# Patient Record
Sex: Female | Born: 1985 | Race: Black or African American | Hispanic: No | Marital: Single | Smoking: Never smoker
Health system: Southern US, Community
[De-identification: ages and names within clinical notes are randomized; demographics above are authoritative.]

## PROBLEM LIST (undated history)

## (undated) DIAGNOSIS — E119 Type 2 diabetes mellitus without complications: Secondary | ICD-10-CM

---

## 2017-06-21 ENCOUNTER — Encounter: Payer: Self-pay | Admitting: Emergency Medicine

## 2017-06-21 ENCOUNTER — Ambulatory Visit
Admission: EM | Admit: 2017-06-21 | Discharge: 2017-06-21 | Disposition: A | Discharge status: Home / Self Care | Payer: BC Managed Care – PPO | Attending: Family Medicine | Admitting: Family Medicine

## 2017-06-21 ENCOUNTER — Other Ambulatory Visit: Payer: Self-pay

## 2017-06-21 DIAGNOSIS — J020 Streptococcal pharyngitis: Secondary | ICD-10-CM

## 2017-06-21 DIAGNOSIS — R05 Cough: Secondary | ICD-10-CM

## 2017-06-21 DIAGNOSIS — M791 Myalgia, unspecified site: Secondary | ICD-10-CM

## 2017-06-21 DIAGNOSIS — J069 Acute upper respiratory infection, unspecified: Secondary | ICD-10-CM

## 2017-06-21 DIAGNOSIS — B9789 Other viral agents as the cause of diseases classified elsewhere: Secondary | ICD-10-CM | POA: Diagnosis not present

## 2017-06-21 DIAGNOSIS — J029 Acute pharyngitis, unspecified: Secondary | ICD-10-CM | POA: Diagnosis not present

## 2017-06-21 HISTORY — DX: Type 2 diabetes mellitus without complications: E11.9

## 2017-06-21 LAB — RAPID STREP SCREEN (MED CTR MEBANE ONLY): STREPTOCOCCUS, GROUP A SCREEN (DIRECT): POSITIVE — AB

## 2017-06-21 MED ORDER — AMOXICILLIN 875 MG PO TABS: 875.0000 mg | ORAL_TABLET | Freq: Two times a day (BID) | ORAL | 0 refills | Status: DC

## 2017-06-21 MED ORDER — HYDROCOD POLST-CPM POLST ER 10-8 MG/5ML PO SUER: 5.0000 mL | Freq: Every evening | ORAL | 0 refills | Status: DC | PRN

## 2017-06-21 NOTE — Discharge Instructions (Signed)
Take medication as prescribed. Rest. Drink plenty of fluids.  ° °Follow up with your primary care physician this week as needed. Return to Urgent care for new or worsening concerns.  ° °

## 2017-06-21 NOTE — ED Triage Notes (Signed)
Patient in today c/o nasal congestion x 2 day and slight sore throat. Patient denies fever, but has had chills. Patient has tried OTC Theraflu and generic Zyrtec.

## 2017-06-21 NOTE — ED Provider Notes (Signed)
[facility] ____________________________________________  Time seen: Approximately 0840 AM  I have reviewed the triage vital signs and the nursing notes.   HISTORY  Chief Complaint Nasal Congestion   HPI Gina Clayton is a 32 y.o. female presenting for evaluation of 2 days of runny nose, nasal congestion, sinus pressure, postnasal drainage, scratchy throat and some body aches.  Denies fevers or sensation of fever.  States sore throat currently is better than yesterday.  States does have a history of allergies and this feels similar and feels similar to previous sinus infections.  Denies chest pain, shortness of breath or hemoptysis.  Has continue to remain active.  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  Denies home sick contacts.  Has taken some over-the-counter decongestants without much change.  Denies other aggravating or alleviating factors. Denies chest pain, shortness of breath, abdominal pain, dysuria, extremity pain, extremity swelling or rash. Denies recent sickness. Denies recent antibiotic use.   Patient's last menstrual period was 05/26/2017 (approximate).   Denies pregnancy    Past Medical History:  Diagnosis Date  . Diabetes mellitus without complication (HCC)    diet and exercise controlled.    There are no active problems to display for this patient.   Past Surgical History:  Procedure Laterality Date  . CESAREAN SECTION       No current facility-administered medications for this encounter.   Current Outpatient Medications:  .  amoxicillin (AMOXIL) 875 MG tablet, Take 1 tablet (875 mg total) by mouth 2 (two) times daily., Disp: 20 tablet, Rfl: 0 .  chlorpheniramine-HYDROcodone (TUSSIONEX PENNKINETIC ER) 10-8 MG/5ML SUER, Take 5 mLs by mouth at bedtime as needed for cough. do not drive or operate machinery while taking as can cause drowsiness., Disp: 50 mL, Rfl: 0  Allergies Patient has no known allergies.  Family  History  Problem Relation Age of Onset  . Hypertension Mother   . Diabetes Father   . Hypertension Father   . Diverticulitis Father     Social History Social History   Tobacco Use  . Smoking status: Never Smoker  . Smokeless tobacco: Never Used  Substance Use Topics  . Alcohol use: Yes    Comment: rarely  . Drug use: No    Review of Systems Constitutional: No fever/chills Eyes: No visual changes. ENT: As above Cardiovascular: Denies chest pain. Respiratory: Denies shortness of breath. Gastrointestinal: No abdominal pain.  No nausea, no vomiting.  No diarrhea. Genitourinary: Negative for dysuria. Musculoskeletal: Negative for back pain. Skin: Negative for rash.   ____________________________________________   PHYSICAL EXAM:  VITAL SIGNS: ED Triage Vitals  Enc Vitals Group     BP 06/21/17 0831 (!) 148/91     Pulse Rate 06/21/17 0831 (!) 101     Resp 06/21/17 0831 20     Temp 06/21/17 0831 98.7 F (37.1 C)     Temp Source 06/21/17 0831 Oral     SpO2 06/21/17 0831 100 %     Weight 06/21/17 0832 240 lb (108.9 kg)     Height 06/21/17 0832 5' (1.524 m)     Head Circumference --      Peak Flow --      Pain Score 06/21/17 0831 3     Pain Loc --      Pain Edu? --      Excl. in GC? --     Constitutional: Alert and oriented. Well appearing and in no acute distress. Eyes: Conjunctivae are normal.  Head:  Atraumatic.  Mild bilateral frontal and maxillary sinus tenderness palpation.  No swelling. No erythema.  Ears: no erythema, normal TMs bilaterally.   Nose:Nasal congestion with clear rhinorrhea  Mouth/Throat: Mucous membranes are moist. No pharyngeal erythema. 2+ tonsillar swelling bilaterally.  No exudate or uvular shift.  Neck: No stridor.  No cervical spine tenderness to palpation. Hematological/Lymphatic/Immunilogical: No cervical lymphadenopathy. Cardiovascular: Normal rate, regular rhythm. Grossly normal heart sounds.  Good peripheral  circulation. Respiratory: Normal respiratory effort.  No retractions. No wheezes, rales or rhonchi. Good air movement.  Musculoskeletal: Ambulatory with steady gait.  Neurologic:  Normal speech and language. No gait instability. Skin:  Skin appears warm, dry and intact. No rash noted. Psychiatric: Mood and affect are normal. Speech and behavior are normal. ___________________________________________   LABS (all labs ordered are listed, but only abnormal results are displayed)  Labs Reviewed  RAPID STREP SCREEN (NOT AT [facility]) - Abnormal; Notable for the following components:      Result Value   Streptococcus, Group A Screen (Direct) POSITIVE (*)    All other components within normal limits   ____________________________________________   PROCEDURES Procedures    INITIAL IMPRESSION / ASSESSMENT AND PLAN / ED COURSE  Pertinent labs & imaging results that were available during my care of the patient were reviewed by me and considered in my medical decision making (see chart for details).  Well-appearing patient.  No acute distress.  Suspect viral upper respiratory infection.  Patient denies fevers, felt less likely to be influenza-like.  Patient reports that she does have baseline large tonsils, sore throat better today than yesterday, however strep positive.  Will treat with oral amoxicillin, as needed Tussionex, over-the-counter Sudafed and supportive agents.  Encouraged rest, fluids, supportive care.  Work note given for today and tomorrow. Discussed indication, risks and benefits of medications with patient.  Discussed follow up with Primary care physician this week. Discussed follow up and return parameters including no resolution or any worsening concerns. Patient verbalized understanding and agreed to plan.   ____________________________________________   FINAL CLINICAL IMPRESSION(S) / ED DIAGNOSES  Final diagnoses:  Viral URI with cough  Strep pharyngitis     ED  Discharge Orders        Ordered    chlorpheniramine-HYDROcodone (TUSSIONEX PENNKINETIC ER) 10-8 MG/5ML SUER  At bedtime PRN     06/21/17 0852    amoxicillin (AMOXIL) 875 MG tablet  2 times daily     06/21/17 0854       Note: This dictation was prepared with Dragon dictation along with smaller phrase technology. Any transcriptional errors that result from this process are unintentional.         Renford Dills, NP 06/21/17 [phone]

## 2018-10-24 ENCOUNTER — Other Ambulatory Visit: Payer: Self-pay

## 2018-10-24 ENCOUNTER — Ambulatory Visit
Admission: EM | Admit: 2018-10-24 | Discharge: 2018-10-24 | Disposition: A | Discharge status: Home / Self Care | Payer: BC Managed Care – PPO | Attending: Family Medicine | Admitting: Family Medicine

## 2018-10-24 ENCOUNTER — Encounter: Payer: Self-pay | Admitting: Emergency Medicine

## 2018-10-24 DIAGNOSIS — R030 Elevated blood-pressure reading, without diagnosis of hypertension: Secondary | ICD-10-CM | POA: Diagnosis not present

## 2018-10-24 DIAGNOSIS — J029 Acute pharyngitis, unspecified: Secondary | ICD-10-CM

## 2018-10-24 LAB — RAPID STREP SCREEN (MED CTR MEBANE ONLY): Streptococcus, Group A Screen (Direct): NEGATIVE

## 2018-10-24 NOTE — ED Triage Notes (Signed)
Patient c/o sore throat that started last night.  Patient denies fevers.  Patient denies cough or SOB.

## 2018-10-24 NOTE — Discharge Instructions (Addendum)
Recommend continue Ibuprofen 600mg  every 6 hours as needed for pain. May continue Zyrtec daily or similar medication. Follow up pending strep culture and COVID 19 testing.

## 2018-10-24 NOTE — ED Provider Notes (Addendum)
[facility]    CSN: [id number] Arrival date & time: 10/24/18  8921     History   Chief Complaint Chief Complaint  Patient presents with  . Sore Throat    HPI Gina Clayton is a 33 y.o. female.   33 year old female presents with sore throat that started last night. Denies any fever, cough or shortness of breath. Having some nasal congestion but denies any GI symptoms. Has history of enlarged tonsils and symptoms seem similar to previous episode of strep last year. Sore throat slightly better today. Has taken Ibuprofen with some relief and Zyrtec with minimal relief. No known exposure to COVID-19.  Does have a history of elevated glucose and blood pressure which are controlled through diet. Currently on Fe and vit D supplements and has [place] IUD in place.   The history is provided by the patient.    Past Medical History:  Diagnosis Date  . Diabetes mellitus without complication (HCC)    diet and exercise controlled.    There are no active problems to display for this patient.   Past Surgical History:  Procedure Laterality Date  . CESAREAN SECTION      OB History   No obstetric history on file.      Home Medications    Prior to Admission medications   Medication Sig Start Date End Date Taking? Authorizing Provider  FEROSUL 325 (65 Fe) MG tablet TK 1 T PO QOD 06/04/18  Yes [provider]  Levonorgestrel (SKYLA) 13.5 MG IUD by Intrauterine route.   Yes [provider]  Vitamin D, Ergocalciferol, (DRISDOL) 1.25 MG (50000 UT) CAPS capsule TK 1 C PO 1 TIME WEEKLY WF 06/03/18  Yes [provider]    Family History Family History  Problem Relation Age of Onset  . Hypertension Mother   . Diabetes Father   . Hypertension Father   . Diverticulitis Father     Social History Social History   Tobacco Use  . Smoking status: Never Smoker  . Smokeless tobacco: Never Used  Substance Use Topics  . Alcohol use: Yes    Comment:  rarely  . Drug use: No     Allergies   Patient has no known allergies.   Review of Systems Review of Systems  Constitutional: Positive for fatigue. Negative for activity change, appetite change, chills and fever.  HENT: Positive for congestion, postnasal drip, rhinorrhea, sore throat and trouble swallowing. Negative for ear discharge, ear pain, facial swelling, mouth sores, nosebleeds, sinus pressure, sinus pain and sneezing.   Eyes: Negative for pain, discharge, redness and itching.  Respiratory: Negative for cough, chest tightness, shortness of breath and wheezing.   Cardiovascular: Negative for chest pain.  Gastrointestinal: Negative for abdominal pain, diarrhea, nausea and vomiting.  Musculoskeletal: Negative for arthralgias, myalgias, neck pain and neck stiffness.  Skin: Negative for color change, rash and wound.  Allergic/Immunologic: Negative for immunocompromised state.  Neurological: Negative for dizziness, tremors, seizures, syncope, weakness, light-headedness, numbness and headaches.  Hematological: Negative for adenopathy. Does not bruise/bleed easily.     Physical Exam Triage Vital Signs ED Triage Vitals  Enc Vitals Group     BP 10/24/18 0839 (!) 151/102     Pulse Rate 10/24/18 0839 (!) 108     Resp 10/24/18 0839 16     Temp 10/24/18 0839 98.4 F (36.9 C)     Temp Source 10/24/18 0839 Oral     SpO2 10/24/18 0839 98 %  Weight 10/24/18 0836 245 lb (111.1 kg)     Height 10/24/18 0836 5' (1.524 m)     Head Circumference --      Peak Flow --      Pain Score 10/24/18 0836 4     Pain Loc --      Pain Edu? --      Excl. in GC? --    No data found.  Updated Vital Signs BP (!) 151/102 (BP Location: Right Arm)   Pulse (!) 108   Temp 98.4 F (36.9 C) (Oral)   Resp 16   Ht 5' (1.524 m)   Wt 245 lb (111.1 kg)   LMP 09/27/2018 (Approximate)   SpO2 98%   BMI 47.85 kg/m   Visual Acuity Right Eye Distance:   Left Eye Distance:   Bilateral Distance:     Right Eye Near:   Left Eye Near:    Bilateral Near:     Physical Exam Vitals signs and nursing note reviewed.  Constitutional:      General: She is awake. She is not in acute distress.    Appearance: She is well-developed and well-groomed. She is not ill-appearing.     Comments: Patient sitting comfortably on exam table in no acute distress.   HENT:     Head: Normocephalic and atraumatic.     Right Ear: Hearing, tympanic membrane, ear canal and external ear normal.     Left Ear: Hearing, tympanic membrane, ear canal and external ear normal.     Nose: Rhinorrhea present.     Right Sinus: No maxillary sinus tenderness or frontal sinus tenderness.     Left Sinus: No maxillary sinus tenderness or frontal sinus tenderness.     Mouth/Throat:     Lips: Pink.     Mouth: Mucous membranes are moist.     Pharynx: Uvula midline. Posterior oropharyngeal erythema present. No pharyngeal swelling or oropharyngeal exudate.     Tonsils: No tonsillar exudate. 3+ on the right. 3+ on the left.  Eyes:     Extraocular Movements: Extraocular movements intact.     Conjunctiva/sclera: Conjunctivae normal.  Neck:     Musculoskeletal: Normal range of motion and neck supple. No neck rigidity or muscular tenderness.  Cardiovascular:     Rate and Rhythm: Regular rhythm. Tachycardia present.     Heart sounds: Normal heart sounds. No murmur.  Pulmonary:     Effort: Pulmonary effort is normal. No respiratory distress.     Breath sounds: Normal breath sounds and air entry. No decreased air movement. No decreased breath sounds, wheezing, rhonchi or rales.  Musculoskeletal: Normal range of motion.  Lymphadenopathy:     Cervical: Cervical adenopathy present.     Right cervical: Superficial cervical adenopathy present.     Left cervical: Superficial cervical adenopathy present.  Skin:    General: Skin is warm and dry.     Capillary Refill: Capillary refill takes less than 2 seconds.     Findings: No rash.   Neurological:     General: No focal deficit present.     Mental Status: She is alert and oriented to person, place, and time.  Psychiatric:        Mood and Affect: Mood normal.        Behavior: Behavior normal. Behavior is cooperative.        Thought Content: Thought content normal.        Judgment: Judgment normal.      UC Treatments / Results  Labs (all labs ordered are listed, but only abnormal results are displayed) Labs Reviewed  RAPID STREP SCREEN ([facility] ONLY)  CULTURE, GROUP A STREP ([facility])  NOVEL CORONAVIRUS, NAA (HOSPITAL ORDER, SEND-OUT TO REF LAB)    EKG   Radiology No results found.  Procedures Procedures (including critical care time)  Medications Ordered in UC Medications - No data to display  Initial Impression / Assessment and Plan / UC Course  I have reviewed the triage vital signs and the nursing notes.  Pertinent labs & imaging results that were available during my care of the patient were reviewed by me and considered in my medical decision making (see chart for details).    Reviewed negative rapid strep test with patient. Will send swab for throat culture. Discussed COVID-19 testing- able to obtain swab here and send out for testing- patient agrees to testing. Discussed that she probably has a viral illness and can not rule out COVID until test results are back. Encouraged to stay home and avoid  contact with other people. May continue Ibuprofen 600mg  every 6 hours as needed for pain. May continue Zyrtec or similar OTC allergy medication for symptoms. Briefly discussed elevated blood pressure- has been elevated in the past- continue to monitor. May need to see her GYN or a PCP for management. Follow-up pending strep culture and COVID-19 test results.  Final Clinical Impressions(s) / UC Diagnoses   Final diagnoses:  Sore throat  Elevated blood-pressure reading, without diagnosis of hypertension     Discharge Instructions     Recommend  continue Ibuprofen 600mg  every 6 hours as needed for pain. May continue Zyrtec daily or similar medication. Follow up pending strep culture and COVID 19 testing.     ED Prescriptions    None     Controlled Substance Prescriptions [place] Controlled Substance Registry consulted? Not Applicable   Sudie Grumblingmyot, Ernan Runkles Berry, NP 10/24/18 1104    Sudie GrumblingAmyot, Maddax Palinkas Berry, NP 10/24/18 1114

## 2018-10-25 LAB — NOVEL CORONAVIRUS, NAA (HOSP ORDER, SEND-OUT TO REF LAB; TAT 18-24 HRS): SARS-CoV-2, NAA: NOT DETECTED

## 2018-10-25 LAB — CULTURE, GROUP A STREP (THRC)

## 2018-10-26 ENCOUNTER — Telehealth (HOSPITAL_COMMUNITY): Payer: Self-pay | Admitting: Emergency Medicine

## 2018-10-26 NOTE — Telephone Encounter (Signed)
Attempted to call patient about neg covid and strep results. Phone number not working in epic.

## 2018-10-31 NOTE — Progress Notes (Signed)
Order placed on 10/24/18. Result not crossing over. Result was finalized on 10/25/18 and able to access results through patient chart in result inquiry. Uncertain if issue with Epic and lab order. May need to call patient with result since it may not be released through [facility].

## 2018-11-11 ENCOUNTER — Other Ambulatory Visit: Payer: Self-pay

## 2018-11-11 ENCOUNTER — Encounter: Payer: Self-pay | Admitting: Emergency Medicine

## 2018-11-11 ENCOUNTER — Emergency Department: Payer: BC Managed Care – PPO

## 2018-11-11 ENCOUNTER — Inpatient Hospital Stay
Admission: EM | Admit: 2018-11-11 | Discharge: 2018-11-12 | DRG: 177 | Payer: BC Managed Care – PPO | Attending: Internal Medicine | Admitting: Internal Medicine

## 2018-11-11 ENCOUNTER — Telehealth: Payer: BC Managed Care – PPO | Admitting: Family

## 2018-11-11 DIAGNOSIS — J1289 Other viral pneumonia: Secondary | ICD-10-CM | POA: Diagnosis present

## 2018-11-11 DIAGNOSIS — R0602 Shortness of breath: Secondary | ICD-10-CM | POA: Diagnosis present

## 2018-11-11 DIAGNOSIS — Z833 Family history of diabetes mellitus: Secondary | ICD-10-CM | POA: Diagnosis not present

## 2018-11-11 DIAGNOSIS — U071 COVID-19: Secondary | ICD-10-CM | POA: Diagnosis present

## 2018-11-11 DIAGNOSIS — E119 Type 2 diabetes mellitus without complications: Secondary | ICD-10-CM | POA: Diagnosis present

## 2018-11-11 DIAGNOSIS — Z6839 Body mass index (BMI) 39.0-39.9, adult: Secondary | ICD-10-CM | POA: Diagnosis not present

## 2018-11-11 DIAGNOSIS — Z975 Presence of (intrauterine) contraceptive device: Secondary | ICD-10-CM | POA: Diagnosis not present

## 2018-11-11 DIAGNOSIS — Z793 Long term (current) use of hormonal contraceptives: Secondary | ICD-10-CM | POA: Diagnosis not present

## 2018-11-11 DIAGNOSIS — Z8249 Family history of ischemic heart disease and other diseases of the circulatory system: Secondary | ICD-10-CM

## 2018-11-11 DIAGNOSIS — Z79899 Other long term (current) drug therapy: Secondary | ICD-10-CM | POA: Diagnosis not present

## 2018-11-11 DIAGNOSIS — R0600 Dyspnea, unspecified: Secondary | ICD-10-CM

## 2018-11-11 DIAGNOSIS — R079 Chest pain, unspecified: Secondary | ICD-10-CM | POA: Diagnosis not present

## 2018-11-11 DIAGNOSIS — E669 Obesity, unspecified: Secondary | ICD-10-CM | POA: Diagnosis not present

## 2018-11-11 DIAGNOSIS — J1282 Pneumonia due to coronavirus disease 2019: Secondary | ICD-10-CM | POA: Diagnosis present

## 2018-11-11 DIAGNOSIS — R000 Tachycardia, unspecified: Secondary | ICD-10-CM | POA: Diagnosis not present

## 2018-11-11 DIAGNOSIS — J069 Acute upper respiratory infection, unspecified: Secondary | ICD-10-CM | POA: Diagnosis not present

## 2018-11-11 LAB — COMPREHENSIVE METABOLIC PANEL
ALT: 18 U/L (ref 0–44)
AST: 33 U/L (ref 15–41)
Albumin: 3.6 g/dL (ref 3.5–5.0)
Alkaline Phosphatase: 88 U/L (ref 38–126)
Anion gap: 12 (ref 5–15)
BUN: 14 mg/dL (ref 6–20)
CO2: 25 mmol/L (ref 22–32)
Calcium: 8.3 mg/dL — ABNORMAL LOW (ref 8.9–10.3)
Chloride: 100 mmol/L (ref 98–111)
Creatinine, Ser: 0.9 mg/dL (ref 0.44–1.00)
GFR calc Af Amer: >60 mL/min (ref >60)
GFR calc non Af Amer: >60 mL/min (ref >60)
Glucose, Bld: 101 mg/dL — ABNORMAL HIGH (ref 70–99)
Potassium: 3.4 mmol/L — ABNORMAL LOW (ref 3.5–5.1)
Sodium: 137 mmol/L (ref 135–145)
Total Bilirubin: 0.6 mg/dL (ref 0.3–1.2)
Total Protein: 8 g/dL (ref 6.5–8.1)

## 2018-11-11 LAB — CBC WITH DIFFERENTIAL/PLATELET
Abs Immature Granulocytes: 0.07 10*3/uL (ref 0.00–0.07)
Basophils Absolute: 0 10*3/uL (ref 0.0–0.1)
Basophils Relative: 0 %
Eosinophils Absolute: 0 10*3/uL (ref 0.0–0.5)
Eosinophils Relative: 0 %
HCT: 39.3 % (ref 36.0–46.0)
Hemoglobin: 12.8 g/dL (ref 12.0–15.0)
Immature Granulocytes: 1 %
Lymphocytes Relative: 20 %
Lymphs Abs: 2.5 10*3/uL (ref 0.7–4.0)
MCH: 27 pg (ref 26.0–34.0)
MCHC: 32.6 g/dL (ref 30.0–36.0)
MCV: 82.9 fL (ref 80.0–100.0)
Monocytes Absolute: 0.3 10*3/uL (ref 0.1–1.0)
Monocytes Relative: 2 %
Neutro Abs: 9.4 10*3/uL — ABNORMAL HIGH (ref 1.7–7.7)
Neutrophils Relative %: 77 %
Platelets: 302 10*3/uL (ref 150–400)
RBC: 4.74 MIL/uL (ref 3.87–5.11)
RDW: 15.8 % — ABNORMAL HIGH (ref 11.5–15.5)
WBC: 12.2 10*3/uL — ABNORMAL HIGH (ref 4.0–10.5)
nRBC: 0 % (ref 0.0–0.2)

## 2018-11-11 LAB — TROPONIN I (HIGH SENSITIVITY): Troponin I (High Sensitivity): 13 ng/L (ref <18)

## 2018-11-11 LAB — PROCALCITONIN: Procalcitonin: <0.1 ng/mL

## 2018-11-11 LAB — LACTIC ACID, PLASMA: Lactic Acid, Venous: 1.3 mmol/L (ref 0.5–1.9)

## 2018-11-11 MED ORDER — ZINC SULFATE 220 (50 ZN) MG PO CAPS
220.0000 mg | ORAL_CAPSULE | Freq: Every day | ORAL | Status: DC
  Administered 2018-11-12: 220 mg via ORAL
  Filled 2018-11-11: qty 1

## 2018-11-11 MED ORDER — FERROUS SULFATE 325 (65 FE) MG PO TABS
325.0000 mg | ORAL_TABLET | Freq: Every day | ORAL | Status: DC
  Administered 2018-11-12: 325 mg via ORAL
  Filled 2018-11-11: qty 1

## 2018-11-11 MED ORDER — LORAZEPAM 2 MG/ML IJ SOLN
1.0000 mg | Freq: Once | INTRAMUSCULAR | Status: AC
  Administered 2018-11-11: 19:00:00 1 mg via INTRAVENOUS

## 2018-11-11 MED ORDER — SODIUM CHLORIDE 0.9% FLUSH: 3.0000 mL | INTRAVENOUS | Status: DC | PRN

## 2018-11-11 MED ORDER — VITAMIN C 500 MG PO TABS
500.0000 mg | ORAL_TABLET | Freq: Every day | ORAL | Status: DC
  Administered 2018-11-12: 08:00:00 500 mg via ORAL
  Filled 2018-11-11: qty 1

## 2018-11-11 MED ORDER — DEXAMETHASONE SODIUM PHOSPHATE 10 MG/ML IJ SOLN
10.0000 mg | Freq: Once | INTRAMUSCULAR | Status: AC
  Administered 2018-11-11: 20:00:00 10 mg via INTRAVENOUS
  Filled 2018-11-11: qty 1

## 2018-11-11 MED ORDER — ENOXAPARIN SODIUM 40 MG/0.4ML ~~LOC~~ SOLN
40.0000 mg | Freq: Two times a day (BID) | SUBCUTANEOUS | Status: DC
  Administered 2018-11-12 (×2): 40 mg via SUBCUTANEOUS
  Filled 2018-11-11 (×2): qty 0.4

## 2018-11-11 MED ORDER — SODIUM CHLORIDE 0.9% FLUSH
3.0000 mL | Freq: Two times a day (BID) | INTRAVENOUS | Status: DC
  Administered 2018-11-12 (×2): 3 mL via INTRAVENOUS

## 2018-11-11 MED ORDER — DEXAMETHASONE SODIUM PHOSPHATE 10 MG/ML IJ SOLN
6.0000 mg | INTRAMUSCULAR | Status: DC
  Filled 2018-11-11: qty 0.6

## 2018-11-11 MED ORDER — SODIUM CHLORIDE 0.9% FLUSH: 3.0000 mL | Freq: Two times a day (BID) | INTRAVENOUS | Status: DC

## 2018-11-11 MED ORDER — ACETAMINOPHEN 325 MG PO TABS: 650.0000 mg | ORAL_TABLET | Freq: Four times a day (QID) | ORAL | Status: DC | PRN

## 2018-11-11 MED ORDER — SODIUM CHLORIDE 0.9 % IV SOLN
Freq: Once | INTRAVENOUS | Status: AC
Start: 2018-11-11 — End: 2018-11-11
  Administered 2018-11-11: 20:00:00 via INTRAVENOUS

## 2018-11-11 MED ORDER — ACETAMINOPHEN 500 MG PO TABS
1000.0000 mg | ORAL_TABLET | Freq: Once | ORAL | Status: AC
  Administered 2018-11-11: 20:00:00 1000 mg via ORAL
  Filled 2018-11-11: qty 2

## 2018-11-11 MED ORDER — SODIUM CHLORIDE 0.9 % IV SOLN: 250.0000 mL | INTRAVENOUS | Status: DC | PRN

## 2018-11-11 MED ORDER — LORAZEPAM 2 MG/ML IJ SOLN
INTRAMUSCULAR | Status: AC
  Filled 2018-11-11: qty 1

## 2018-11-11 MED ORDER — IPRATROPIUM-ALBUTEROL 20-100 MCG/ACT IN AERS
1.0000 | INHALATION_SPRAY | Freq: Four times a day (QID) | RESPIRATORY_TRACT | Status: DC
  Administered 2018-11-12 (×3): 1 via RESPIRATORY_TRACT
  Filled 2018-11-11: qty 4

## 2018-11-11 NOTE — Progress Notes (Signed)
  E-Visit for Corona Virus Screening  Based on what you have shared with me, you need to seek an evaluation for a severe illness that is causing your symptoms which may be coronavirus or some other illness. I recommend that you be seen and evaluated "face to face". Our Emergency Departments are best equipped to handle patients with severe symptoms.  You will be evaluated by the ER provider (or higher level of care provider) who will determine whether you need formal testing.   I recommend the following:  . If you are having a true medical emergency please call 911. . If you are considered high risk for Corona virus because of a known exposure, fever, shortness of breath and cough, OR if you have severe symptoms of any kind, seek medical care at an emergency room.   . [facility] Emergency Department [address], [place], [place] [address] [phone]  . [facility] [place] Emergency Department [address], [place], [place] [address] [phone]  . [facility] Emergency Department [address], [place], [place] [address] [phone]  . [facility] Emergency Department [address], [place], [place] [address] [phone]  . [facility] Emergency Department [address], [place], [place] [address] [phone]  NOTE: If you entered your credit card information for this eVisit, you will not be charged. You may see a "hold" on your card for the $35 but that hold will drop off and you will not have a charge processed.   Your e-visit answers were reviewed by a board certified advanced clinical practitioner to complete your personal care plan.  Thank you for using e-Visits.  

## 2018-11-11 NOTE — ED Notes (Signed)
Labs redrawn

## 2018-11-11 NOTE — H&P (Signed)
[facility] - [place] at [facility]   PATIENT NAME: Gina Clayton    MR#:  [id number]  DATE OF BIRTH:  04/12/1985  DATE OF ADMISSION:  11/11/2018  PRIMARY CARE PHYSICIAN: Patient, No Pcp Per   REQUESTING/REFERRING PHYSICIAN: Shaune Pollackameron Isaacs, MD  CHIEF COMPLAINT:   Chief Complaint  Patient presents with  . Chest Pain  . Shortness of Breath  . COVID Positive    HISTORY OF PRESENT ILLNESS:  Gina Clayton  is a 33 y.o. female who presented to the emergency room with increased shortness of breath and productive cough of clear mucus.  She was diagnosed with COVID-19 on 11/02/2018 at [facility].  She began having symptoms of increased shortness of breath as well as intermittent fevers and chills with malaise/fatigue and muscle aches on 11/06/2018.  Shortness of breath is made worse with minimal exertion such as walking less than one half city block on a flat surface.  She reports having developed nausea and vomiting today.  Temperature max at home has been 103 F.  She denies hematemesis, hematochezia, or melena.  She denies chest pain other than pain with severe cough.  She denies diarrhea or abdominal pain.  She denies a prior history of asthma or other pulmonary related problems.  She received Decadron IV shortly after arrival to the emergency room with improvement of oxygen saturation from 87% on room air to 96% currently on 2 L oxygen per nasal cannula.  Chest x-ray demonstrates perihilar opacities consistent with viral pneumonia.  EKG demonstrates sinus tachycardia with no ST elevations.  We have admitted her to the hospitalist service for further management.    PAST MEDICAL HISTORY:   Past Medical History:  Diagnosis Date  . Diabetes mellitus without complication (HCC)    diet and exercise controlled.    PAST SURGICAL HISTORY:   Past Surgical History:  Procedure Laterality Date  . CESAREAN SECTION      SOCIAL HISTORY:   Social History   Tobacco Use   . Smoking status: Never Smoker  . Smokeless tobacco: Never Used  Substance Use Topics  . Alcohol use: Yes    Comment: rarely    FAMILY HISTORY:   Family History  Problem Relation Age of Onset  . Hypertension Mother   . Diabetes Father   . Hypertension Father   . Diverticulitis Father     DRUG ALLERGIES:  No Known Allergies  REVIEW OF SYSTEMS:   Review of Systems  Constitutional: Positive for chills, fever and malaise/fatigue.  HENT: Positive for congestion. Negative for sinus pain and sore throat.   Eyes: Negative for blurred vision and double vision.  Respiratory: Positive for cough, sputum production, shortness of breath and wheezing.   Cardiovascular: Positive for chest pain (With cough). Negative for palpitations and leg swelling.  Gastrointestinal: Positive for nausea and vomiting. Negative for abdominal pain, blood in stool, constipation, diarrhea and heartburn.  Genitourinary: Negative for dysuria, flank pain and hematuria.  Musculoskeletal: Negative for falls and myalgias.  Skin: Negative for itching and rash.  Neurological: Negative for dizziness, focal weakness, loss of consciousness, weakness and headaches.  Psychiatric/Behavioral: Negative.  Negative for depression.      MEDICATIONS AT HOME:   Prior to Admission medications   Medication Sig Start Date End Date Taking? Authorizing Provider  FEROSUL 325 (65 Fe) MG tablet TK 1 T PO QOD 06/04/18  Yes [provider]  Levonorgestrel (SKYLA) 13.5 MG IUD by Intrauterine route.   Yes [provider]  Vitamin D, Ergocalciferol, (DRISDOL) 1.25 MG (50000 UT) CAPS capsule TK 1 C PO 1 TIME WEEKLY WF 06/03/18  Yes [provider]      VITAL SIGNS:  Blood pressure (!) 116/98, pulse 100, temperature 99.4 F (37.4 C), temperature source Oral, resp. rate (!) 28, height 5' (1.524 m), weight 111.1 kg, last menstrual period 10/31/2018, SpO2 98 %.  PHYSICAL EXAMINATION:  Physical Exam  GENERAL:   33 y.o.-year-old patient lying in the bed with no acute distress.  EYES: Pupils equal, round, reactive to light and accommodation. No scleral icterus. Extraocular muscles intact.  HEENT: Head atraumatic, normocephalic. Oropharynx and nasopharynx clear.  NECK:  Supple, no jugular venous distention. No thyroid enlargement, no tenderness.  LUNGS: Bilateral expiratory wheezes. no use of accessory muscles of respiration.  CARDIOVASCULAR: Regular rate and rhythm, S1, S2 normal. No murmurs, rubs, or gallops.  ABDOMEN: Abdominal obesity soft, nondistended, nontender. Bowel sounds present. No organomegaly or mass.  EXTREMITIES: No pedal edema, cyanosis, or clubbing.  NEUROLOGIC: Cranial nerves II through XII are intact. Muscle strength 5/5 in all extremities. Sensation intact. Gait not checked.  PSYCHIATRIC: The patient is alert and oriented x 3.  Normal affect and good eye contact. SKIN: No obvious rash, lesion, or ulcer.   LABORATORY PANEL:   CBC Recent Labs  Lab 11/11/18 1918  WBC 12.2*  HGB 12.8  HCT 39.3  PLT 302   ------------------------------------------------------------------------------------------------------------------  Chemistries  Recent Labs  Lab 11/11/18 2018  NA 137  K 3.4*  CL 100  CO2 25  GLUCOSE 101*  BUN 14  CREATININE 0.90  CALCIUM 8.3*  AST 33  ALT 18  ALKPHOS 88  BILITOT 0.6   ------------------------------------------------------------------------------------------------------------------  Cardiac Enzymes No results for input(s): TROPONINI in the last 168 hours. ------------------------------------------------------------------------------------------------------------------  RADIOLOGY:  Dg Chest Portable 1 View  Result Date: 11/11/2018 CLINICAL DATA:  Pt to ED via POV stating that she tested + for COVID Friday before last. Pt states that she is having severe chest pain, shortness of breath and coughing.SOB, cough EXAM: PORTABLE CHEST 1 VIEW  COMPARISON:  None. FINDINGS: Normal cardiac silhouette. Perihilar airspace densities. Low lung volumes. No acute osseous abnormality. IMPRESSION: Perihilar airspace densities consistent with viral pneumonia. Electronically Signed   By: Genevive BiStewart  Edmunds M.D.   On: 11/11/2018 20:29      IMPRESSION AND PLAN:   1.  Viral pneumonia secondary to COVID-19 - She has O2 at 2 L/min per nasal cannula - We will continue IV Decadron 6 mg daily - Combivent inhaler every 6 hours as needed for shortness of breath -Sputum culture pending - Interleukin-6 pending -We will treat nausea and vomiting with IV antiemetic -Treat fever with Tylenol as needed  2.  Generalized weakness - We will consult physical therapy for supportive care  3.  History of diabetes mellitus with diet control - Will monitor with every 6 hour glucometer readings as patient is receiving IV steroid therapy  DVT and PPI prophylaxis    All the records are reviewed and case discussed with ED provider. The plan of care was discussed in details with the patient (and family). I answered all questions. The patient agreed to proceed with the above mentioned plan. Further management will depend upon hospital course.   CODE STATUS: Full code  TOTAL TIME TAKING CARE OF THIS PATIENT:45 minutes.    Milas Kocherngela H [place] crnp on 11/11/2018 at 11:04 PM  Pager - [phone]  After 6pm go to [email or web address] - password [email or web address]  [facility] [place] [facility]  Office  [phone]  CC: Primary care physician; Patient, No Pcp Per   Note: This dictation was prepared with Dragon dictation along with smaller phrase technology. Any transcriptional errors that result from this process are unintentional.

## 2018-11-11 NOTE — ED Notes (Signed)
First Nurse Note: Pt to ED via [place] stating that she tested + for COVID Friday before last. Pt states that she is having severe chest pain, shortness of breath and coughing.

## 2018-11-11 NOTE — ED Triage Notes (Signed)
Pt arrived via POV with reports of shortness of breath, cough and fever. Pt reports fever since Wednesday.   Pt reports COVID positive 7/24.

## 2018-11-11 NOTE — ED Provider Notes (Signed)
[facility] Emergency Department Provider Note  ____________________________________________   First MD Initiated Contact with Patient 11/11/18 1853     (approximate)  I have reviewed the triage vital signs and the nursing notes.   HISTORY  Chief Complaint Chest Pain, Shortness of Breath, and COVID Positive    HPI Gina Clayton is a 33 y.o. female  Here with cough, SOB. Pt was just diagnosed with COVID-19 on 7/24 at [facility]. She states that since then, she's had persistent cough,f ever, and now SOB. Over the past 24 hours, she's had worsening cough and SOB and has been unable to catch her breath. She's also developed nausea and emesis today. She has had persistent fevers, up to 103 F, over the past week. No h/o lung problems. Sx are worse with any kind of movement or exertion. She's also now developed some nausea and vomiting.        Past Medical History:  Diagnosis Date  . Diabetes mellitus without complication (HCC)    diet and exercise controlled.    Patient Active Problem List   Diagnosis Date Noted  . Pneumonia due to 2019 novel coronavirus 11/11/2018    Past Surgical History:  Procedure Laterality Date  . CESAREAN SECTION      Prior to Admission medications   Medication Sig Start Date End Date Taking? Authorizing Provider  FEROSUL 325 (65 Fe) MG tablet TK 1 T PO QOD 06/04/18  Yes [provider]  Levonorgestrel (SKYLA) 13.5 MG IUD by Intrauterine route.   Yes [provider]  Vitamin D, Ergocalciferol, (DRISDOL) 1.25 MG (50000 UT) CAPS capsule TK 1 C PO 1 TIME WEEKLY WF 06/03/18  Yes [provider]    Allergies Patient has no known allergies.  Family History  Problem Relation Age of Onset  . Hypertension Mother   . Diabetes Father   . Hypertension Father   . Diverticulitis Father     Social History Social History   Tobacco Use  . Smoking status: Never Smoker  . Smokeless tobacco: Never Used   Substance Use Topics  . Alcohol use: Yes    Comment: rarely  . Drug use: No    Review of Systems  Review of Systems  Constitutional: Positive for fatigue and fever.  HENT: Negative for congestion and sore throat.   Eyes: Negative for visual disturbance.  Respiratory: Positive for cough and shortness of breath.   Cardiovascular: Negative for chest pain.  Gastrointestinal: Positive for nausea and vomiting. Negative for abdominal pain and diarrhea.  Genitourinary: Negative for flank pain.  Musculoskeletal: Negative for back pain and neck pain.  Skin: Negative for rash and wound.  Neurological: Positive for weakness.  All other systems reviewed and are negative.    ____________________________________________  PHYSICAL EXAM:      VITAL SIGNS: ED Triage Vitals  Enc Vitals Group     BP 11/11/18 1900 136/87     Pulse Rate 11/11/18 1900 (!) 120     Resp 11/11/18 1900 (!) 26     Temp 11/11/18 1900 100.1 F (37.8 C)     Temp Source 11/11/18 1900 Oral     SpO2 11/11/18 1900 93 %     Weight 11/11/18 1853 245 lb (111.1 kg)     Height 11/11/18 1853 5' (1.524 m)     Head Circumference --      Peak Flow --      Pain Score 11/11/18 1852 7     Pain Loc --  Pain Edu? --      Excl. in GC? --      Physical Exam Vitals signs and nursing note reviewed.  Constitutional:      General: She is not in acute distress.    Appearance: She is well-developed.  HENT:     Head: Normocephalic and atraumatic.  Eyes:     Conjunctiva/sclera: Conjunctivae normal.  Neck:     Musculoskeletal: Neck supple.  Cardiovascular:     Rate and Rhythm: Regular rhythm. Tachycardia present.     Heart sounds: Normal heart sounds.  Pulmonary:     Effort: Pulmonary effort is normal. Tachypnea present. No respiratory distress.     Breath sounds: Rhonchi and rales present. No wheezing.  Abdominal:     General: There is no distension.  Skin:    General: Skin is warm.     Capillary Refill: Capillary  refill takes less than 2 seconds.     Findings: No rash.  Neurological:     Mental Status: She is alert and oriented to person, place, and time.     Motor: No abnormal muscle tone.       ____________________________________________   LABS (all labs ordered are listed, but only abnormal results are displayed)  Labs Reviewed  CBC WITH DIFFERENTIAL/PLATELET - Abnormal; Notable for the following components:      Result Value   WBC 12.2 (*)    RDW 15.8 (*)    Neutro Abs 9.4 (*)    All other components within normal limits  COMPREHENSIVE METABOLIC PANEL - Abnormal; Notable for the following components:   Potassium 3.4 (*)    Glucose, Bld 101 (*)    Calcium 8.3 (*)    All other components within normal limits  SARS CORONAVIRUS 2 (HOSPITAL ORDER, PERFORMED IN [facility])  CULTURE, BLOOD (ROUTINE X 2)  CULTURE, BLOOD (ROUTINE X 2)  LACTIC ACID, PLASMA  PROCALCITONIN  HIV ANTIBODY (ROUTINE TESTING W REFLEX)  CBC  CREATININE, SERUM  CBC WITH DIFFERENTIAL/PLATELET  C-REACTIVE PROTEIN  COMPREHENSIVE METABOLIC PANEL  INTERLEUKIN-6, PLASMA  POC URINE PREG, ED  ABO/RH  TROPONIN I (HIGH SENSITIVITY)  TROPONIN I (HIGH SENSITIVITY)  TROPONIN I (HIGH SENSITIVITY)    ____________________________________________  EKG: Sinus tachycardia, VR 109. Normal intervals. Diffuse TWI, possibly demand. No ST elevations. ________________________________________  RADIOLOGY All imaging, including plain films, CT scans, and ultrasounds, independently reviewed by me, and interpretations confirmed via formal radiology reads.  ED MD interpretation:   CXR: Perihilar opacities c/w viral PNA  Official radiology report(s): Dg Chest Portable 1 View  Result Date: 11/11/2018 CLINICAL DATA:  Pt to ED via POV stating that she tested + for COVID Friday before last. Pt states that she is having severe chest pain, shortness of breath and coughing.SOB, cough EXAM: PORTABLE CHEST 1 VIEW COMPARISON:   None. FINDINGS: Normal cardiac silhouette. Perihilar airspace densities. Low lung volumes. No acute osseous abnormality. IMPRESSION: Perihilar airspace densities consistent with viral pneumonia. Electronically Signed   By: Genevive BiStewart  Edmunds M.D.   On: 11/11/2018 20:29    ____________________________________________  PROCEDURES   Procedure(s) performed (including Critical Care):  Procedures  ____________________________________________  INITIAL IMPRESSION / MDM / ASSESSMENT AND PLAN / ED COURSE  As part of my medical decision making, I reviewed the following data within the electronic MEDICAL RECORD NUMBER Notes from prior ED visits and [place] Controlled Substance Database      *Gina Clayton was evaluated in Emergency Department on 11/12/2018 for the symptoms described in the  history of present illness. She was evaluated in the context of the global COVID-19 pandemic, which necessitated consideration that the patient might be at risk for infection with the SARS-CoV-2 virus that causes COVID-19. Institutional protocols and algorithms that pertain to the evaluation of patients at risk for COVID-19 are in a state of rapid change based on information released by regulatory bodies including the CDC and federal and state organizations. These policies and algorithms were followed during the patient's care in the ED.  Some ED evaluations and interventions may be delayed as a result of limited staffing during the pandemic.*      Medical Decision Making: 33 yo F here with hypoxic resp failure 2/2 COVID-19. CXR c/w viral PNA. [place], LA normal - doubt superimposed bacterial infection. Gentle fluids, tylenol, Decadron given. Admit.  ____________________________________________  FINAL CLINICAL IMPRESSION(S) / ED DIAGNOSES  Final diagnoses:  Pneumonia due to COVID-19 virus     MEDICATIONS GIVEN DURING THIS VISIT:  Medications  LORazepam (ATIVAN) 2 MG/ML injection (has no administration in time range)   ferrous sulfate tablet 325 mg (has no administration in time range)  enoxaparin (LOVENOX) injection 40 mg (has no administration in time range)  Ipratropium-Albuterol (COMBIVENT) respimat 1 puff (has no administration in time range)  vitamin C (ASCORBIC ACID) tablet 500 mg (has no administration in time range)  zinc sulfate capsule 220 mg (has no administration in time range)  acetaminophen (TYLENOL) tablet 650 mg (has no administration in time range)  sodium chloride flush (NS) 0.9 % injection 3 mL (has no administration in time range)  sodium chloride flush (NS) 0.9 % injection 3 mL (has no administration in time range)  0.9 %  sodium chloride infusion (has no administration in time range)  dexamethasone (DECADRON) injection 6 mg (has no administration in time range)  LORazepam (ATIVAN) injection 1 mg (1 mg Intravenous Given 11/11/18 1928)  dexamethasone (DECADRON) injection 10 mg (10 mg Intravenous Given 11/11/18 2015)  0.9 %  sodium chloride infusion ( Intravenous Stopped 11/11/18 2302)  acetaminophen (TYLENOL) tablet 1,000 mg (1,000 mg Oral Given 11/11/18 2013)     ED Discharge Orders    None       Note:  This document was prepared using Dragon voice recognition software and may include unintentional dictation errors.   Duffy Bruce, MD 11/12/18 [phone]

## 2018-11-11 NOTE — ED Notes (Signed)
ED TO INPATIENT HANDOFF REPORT  ED Nurse Name and Phone #: Bascom Levels Name/Age/Gender Gina Clayton 33 y.o. female Room/Bed: ED01A/ED01A  Code Status   Code Status: Full Code  Home/SNF/Other Home Patient oriented to: self, place, time and situation Is this baseline? Yes   Triage Complete: Triage complete  Chief Complaint COVID +  Triage Note Pt arrived via POV with reports of shortness of breath, cough and fever. Pt reports fever since Wednesday.   Pt reports COVID positive 7/24.    Allergies No Known Allergies  Level of Care/Admitting Diagnosis ED Disposition    ED Disposition Condition [facility] Area: [place] [[id number]]  Level of Care: Med-Surg [16]  Covid Evaluation: Confirmed COVID Positive  Diagnosis: Pneumonia due to 2019 novel coronavirus [[id number]]  Admitting Physician: Mayer Camel [[id number]]  Attending Physician: Mayer Camel [[id number]]  Estimated length of stay: past midnight tomorrow  Certification:: I certify this patient will need inpatient services for at least 2 midnights  PT Class (Do Not Modify): Inpatient [101]  PT Acc Code (Do Not Modify): Private [1]       B Medical/Surgery History Past Medical History:  Diagnosis Date  . Diabetes mellitus without complication (HCC)    diet and exercise controlled.   Past Surgical History:  Procedure Laterality Date  . CESAREAN SECTION       A IV Location/Drains/Wounds Patient Lines/Drains/Airways Status   Active Line/Drains/Airways    Name:   Placement date:   Placement time:   Site:   Days:   Peripheral IV 11/11/18 Left Antecubital   11/11/18    1931    Antecubital   less than 1          Intake/Output Last 24 hours No intake or output data in the 24 hours ending 11/11/18 2300  Labs/Imaging Results for orders placed or performed during the hospital encounter of 11/11/18 (from the past 48 hour(s))  CBC with Differential     Status: Abnormal   Collection Time: 11/11/18  7:18 PM  Result Value Ref Range   WBC 12.2 (H) 4.0 - 10.5 K/uL   RBC 4.74 3.87 - 5.11 MIL/uL   Hemoglobin 12.8 12.0 - 15.0 g/dL   HCT 39.3 36.0 - 46.0 %   MCV 82.9 80.0 - 100.0 fL   MCH 27.0 26.0 - 34.0 pg   MCHC 32.6 30.0 - 36.0 g/dL   RDW 15.8 (H) 11.5 - 15.5 %   Platelets 302 150 - 400 K/uL   nRBC 0.0 0.0 - 0.2 %   Neutrophils Relative % 77 %   Neutro Abs 9.4 (H) 1.7 - 7.7 K/uL   Lymphocytes Relative 20 %   Lymphs Abs 2.5 0.7 - 4.0 K/uL   Monocytes Relative 2 %   Monocytes Absolute 0.3 0.1 - 1.0 K/uL   Eosinophils Relative 0 %   Eosinophils Absolute 0.0 0.0 - 0.5 K/uL   Basophils Relative 0 %   Basophils Absolute 0.0 0.0 - 0.1 K/uL   Immature Granulocytes 1 %   Abs Immature Granulocytes 0.07 0.00 - 0.07 K/uL   Abnormal Lymphocytes Present PRESENT     Comment: Performed at [facility], [address] [place], [place] [address]  Comprehensive metabolic panel     Status: Abnormal   Collection Time: 11/11/18  8:18 PM  Result Value Ref Range   Sodium 137 135 - 145 mmol/L   Potassium 3.4 (L) 3.5 - 5.1 mmol/L   Chloride  100 98 - 111 mmol/L   CO2 25 22 - 32 mmol/L   Glucose, Bld 101 (H) 70 - 99 mg/dL   BUN 14 6 - 20 mg/dL   Creatinine, Ser 6.210.90 0.44 - 1.00 mg/dL   Calcium 8.3 (L) 8.9 - 10.3 mg/dL   Total Protein 8.0 6.5 - 8.1 g/dL   Albumin 3.6 3.5 - 5.0 g/dL   AST 33 15 - 41 U/L   ALT 18 0 - 44 U/L   Alkaline Phosphatase 88 38 - 126 U/L   Total Bilirubin 0.6 0.3 - 1.2 mg/dL   GFR calc non Af Amer >60 >60 mL/min   GFR calc Af Amer >60 >60 mL/min   Anion gap 12 5 - 15    Comment: Performed at [facility], [address] [place], [place] [address]  Lactic acid, plasma     Status: None   Collection Time: 11/11/18  8:18 PM  Result Value Ref Range   Lactic Acid, Venous 1.3 0.5 - 1.9 mmol/L    Comment: Performed at [facility], [address] [place], [place] [address]  Procalcitonin     Status: None   Collection  Time: 11/11/18  8:18 PM  Result Value Ref Range   Procalcitonin <0.10 ng/mL    Comment:        Interpretation: PCT (Procalcitonin) <= 0.5 ng/mL: Systemic infection (sepsis) is not likely. Local bacterial infection is possible. (NOTE)       Sepsis PCT Algorithm           Lower Respiratory Tract                                      Infection PCT Algorithm    ----------------------------     ----------------------------         PCT < 0.25 ng/mL                PCT < 0.10 ng/mL         Strongly encourage             Strongly discourage   discontinuation of antibiotics    initiation of antibiotics    ----------------------------     -----------------------------       PCT 0.25 - 0.50 ng/mL            PCT 0.10 - 0.25 ng/mL               OR       >80% decrease in PCT            Discourage initiation of                                            antibiotics      Encourage discontinuation           of antibiotics    ----------------------------     -----------------------------         PCT >= 0.50 ng/mL              PCT 0.26 - 0.50 ng/mL               AND        <80% decrease in PCT  Encourage initiation of                                             antibiotics       Encourage continuation           of antibiotics    ----------------------------     -----------------------------        PCT >= 0.50 ng/mL                  PCT > 0.50 ng/mL               AND         increase in PCT                  Strongly encourage                                      initiation of antibiotics    Strongly encourage escalation           of antibiotics                                     -----------------------------                                           PCT <= 0.25 ng/mL                                                 OR                                        > 80% decrease in PCT                                     Discontinue / Do not initiate                                              antibiotics Performed at [facility], [address], [place], [place] [address]   Troponin I (High Sensitivity)     Status: None   Collection Time: 11/11/18  8:18 PM  Result Value Ref Range   Troponin I (High Sensitivity) 13 <18 ng/L    Comment: (NOTE) Elevated high sensitivity troponin I (hsTnI) values and significant  changes across serial measurements may suggest ACS but many other  chronic and acute conditions are known to elevate hsTnI results.  Refer to the "Links" section for chest pain algorithms and additional  guidance. Performed at [facility], [address] [place], [place] [address]    Dg Chest Portable 1 View  Result Date: 11/11/2018 CLINICAL DATA:  Pt to  ED via POV stating that she tested + for COVID Friday before last. Pt states that she is having severe chest pain, shortness of breath and coughing.SOB, cough EXAM: PORTABLE CHEST 1 VIEW COMPARISON:  None. FINDINGS: Normal cardiac silhouette. Perihilar airspace densities. Low lung volumes. No acute osseous abnormality. IMPRESSION: Perihilar airspace densities consistent with viral pneumonia. Electronically Signed   By: Genevive BiStewart  Edmunds M.D.   On: 11/11/2018 20:29    Pending Labs Unresulted Labs (From admission, onward)    Start     Ordered   11/18/18 0500  Creatinine, serum  (enoxaparin (LOVENOX)    CrCl >/= 30 ml/min)  Weekly,   STAT    Comments: while on enoxaparin therapy    11/11/18 2256   11/12/18 0500  CBC with Differential/Platelet  Daily,   STAT     11/11/18 2256   11/12/18 0500  C-reactive protein  Daily,   STAT     11/11/18 2256   11/12/18 0500  Comprehensive metabolic panel  Daily,   STAT     11/11/18 2256   11/11/18 2258  Interleukin-6, Plasma  Once,   STAT     11/11/18 2257   11/11/18 2257  SARS Coronavirus 2 [facility](Hospital order, Performed in [facility] hospital lab) Nasopharyngeal Nasopharyngeal Swab  (Novel Coronavirus, [facility]Hospital Order))  Once,   STAT    Question Answer  Comment  Is this test for diagnosis or screening Diagnosis of ill patient   Symptomatic for COVID-19 as defined by CDC Yes   Date of Symptom Onset 11/06/2018   Hospitalized for COVID-19 Yes   Admitted to ICU for COVID-19 No   Previously tested for COVID-19 Yes   Resident in a congregate (group) care setting No   Employed in healthcare setting No   Pregnant No      11/11/18 2256   11/11/18 2257  HIV antibody (Routine Testing)  Once,   STAT     11/11/18 2256   11/11/18 2257  ABO/Rh  Once,   STAT     11/11/18 2256   11/11/18 2257  CBC  (enoxaparin (LOVENOX)    CrCl >/= 30 ml/min)  Once,   STAT    Comments: Baseline for enoxaparin therapy IF NOT ALREADY DRAWN.  Notify MD if PLT < 100 K.    11/11/18 2256   11/11/18 2257  Creatinine, serum  (enoxaparin (LOVENOX)    CrCl >/= 30 ml/min)  Once,   STAT    Comments: Baseline for enoxaparin therapy IF NOT ALREADY DRAWN.    11/11/18 2256   11/11/18 2257  Culture, blood (Routine X 2) w Reflex to ID Panel  BLOOD CULTURE X 2,   STAT     11/11/18 2256          Vitals/Pain Today's Vitals   11/11/18 2145 11/11/18 2200 11/11/18 2230 11/11/18 2230  BP:  135/88 122/81   Pulse: (!) 106 (!) 106 (!) 102   Resp: (!) 25 (!) 39 (!) 30   Temp:    99.4 F (37.4 C)  TempSrc:    Oral  SpO2: 94% 94% 96%   Weight:      Height:      PainSc:        Isolation Precautions Airborne and Contact precautions  Medications Medications  LORazepam (ATIVAN) 2 MG/ML injection (has no administration in time range)  ferrous sulfate tablet 325 mg (has no administration in time range)  enoxaparin (LOVENOX) injection 40 mg (has no administration in time range)  Ipratropium-Albuterol (COMBIVENT) respimat 1 puff (has no administration in time range)  vitamin C (ASCORBIC ACID) tablet 500 mg (has no administration in time range)  zinc sulfate capsule 220 mg (has no administration in time range)  acetaminophen (TYLENOL) tablet 650 mg (has no administration in time  range)  sodium chloride flush (NS) 0.9 % injection 3 mL (has no administration in time range)  sodium chloride flush (NS) 0.9 % injection 3 mL (has no administration in time range)  0.9 %  sodium chloride infusion (has no administration in time range)  dexamethasone (DECADRON) injection 6 mg (has no administration in time range)  LORazepam (ATIVAN) injection 1 mg (1 mg Intravenous Given 11/11/18 1928)  dexamethasone (DECADRON) injection 10 mg (10 mg Intravenous Given 11/11/18 2015)  0.9 %  sodium chloride infusion ( Intravenous New Bag/Given 11/11/18 2012)  acetaminophen (TYLENOL) tablet 1,000 mg (1,000 mg Oral Given 11/11/18 2013)    Mobility walks with person assist Low fall risk   Focused Assessments Cardiac Assessment Handoff:    No results found for: CKTOTAL, CKMB, CKMBINDEX, TROPONINI No results found for: DDIMER Does the Patient currently have chest pain? No      R Recommendations: See Admitting Provider Note  Report given to:   Additional Notes: COVID

## 2018-11-12 ENCOUNTER — Encounter (HOSPITAL_COMMUNITY): Payer: Self-pay

## 2018-11-12 ENCOUNTER — Observation Stay (HOSPITAL_COMMUNITY)
Admission: AD | Admit: 2018-11-12 | Discharge: 2018-11-13 | Disposition: A | Discharge status: Home / Self Care | Payer: BC Managed Care – PPO | Source: Other Acute Inpatient Hospital | Attending: Internal Medicine | Admitting: Internal Medicine

## 2018-11-12 DIAGNOSIS — J069 Acute upper respiratory infection, unspecified: Secondary | ICD-10-CM | POA: Insufficient documentation

## 2018-11-12 DIAGNOSIS — E119 Type 2 diabetes mellitus without complications: Secondary | ICD-10-CM | POA: Insufficient documentation

## 2018-11-12 DIAGNOSIS — Z8249 Family history of ischemic heart disease and other diseases of the circulatory system: Secondary | ICD-10-CM | POA: Insufficient documentation

## 2018-11-12 DIAGNOSIS — R000 Tachycardia, unspecified: Secondary | ICD-10-CM | POA: Diagnosis present

## 2018-11-12 DIAGNOSIS — Z79899 Other long term (current) drug therapy: Secondary | ICD-10-CM | POA: Insufficient documentation

## 2018-11-12 DIAGNOSIS — Z6839 Body mass index (BMI) 39.0-39.9, adult: Secondary | ICD-10-CM | POA: Insufficient documentation

## 2018-11-12 DIAGNOSIS — U071 COVID-19: Principal | ICD-10-CM | POA: Diagnosis present

## 2018-11-12 DIAGNOSIS — Z975 Presence of (intrauterine) contraceptive device: Secondary | ICD-10-CM | POA: Insufficient documentation

## 2018-11-12 DIAGNOSIS — E669 Obesity, unspecified: Secondary | ICD-10-CM | POA: Diagnosis present

## 2018-11-12 DIAGNOSIS — Z793 Long term (current) use of hormonal contraceptives: Secondary | ICD-10-CM | POA: Insufficient documentation

## 2018-11-12 DIAGNOSIS — R079 Chest pain, unspecified: Secondary | ICD-10-CM

## 2018-11-12 LAB — COMPREHENSIVE METABOLIC PANEL
ALT: 18 U/L (ref 0–44)
ALT: 20 U/L (ref 0–44)
AST: 35 U/L (ref 15–41)
AST: 36 U/L (ref 15–41)
Albumin: 3.4 g/dL — ABNORMAL LOW (ref 3.5–5.0)
Albumin: 3.2 g/dL — ABNORMAL LOW (ref 3.5–5.0)
Alkaline Phosphatase: 84 U/L (ref 38–126)
Alkaline Phosphatase: 82 U/L (ref 38–126)
Anion gap: 10 (ref 5–15)
Anion gap: 10 (ref 5–15)
BUN: 12 mg/dL (ref 6–20)
BUN: 12 mg/dL (ref 6–20)
CO2: 23 mmol/L (ref 22–32)
CO2: 26 mmol/L (ref 22–32)
Calcium: 8.1 mg/dL — ABNORMAL LOW (ref 8.9–10.3)
Calcium: 8.8 mg/dL — ABNORMAL LOW (ref 8.9–10.3)
Chloride: 104 mmol/L (ref 98–111)
Chloride: 102 mmol/L (ref 98–111)
Creatinine, Ser: 0.78 mg/dL (ref 0.44–1.00)
Creatinine, Ser: 0.77 mg/dL (ref 0.44–1.00)
GFR calc Af Amer: >60 mL/min (ref >60)
GFR calc Af Amer: >60 mL/min (ref >60)
GFR calc non Af Amer: >60 mL/min (ref >60)
GFR calc non Af Amer: >60 mL/min (ref >60)
Glucose, Bld: 133 mg/dL — ABNORMAL HIGH (ref 70–99)
Glucose, Bld: 138 mg/dL — ABNORMAL HIGH (ref 70–99)
Potassium: 3.7 mmol/L (ref 3.5–5.1)
Potassium: 3.6 mmol/L (ref 3.5–5.1)
Sodium: 137 mmol/L (ref 135–145)
Sodium: 138 mmol/L (ref 135–145)
Total Bilirubin: 0.6 mg/dL (ref 0.3–1.2)
Total Bilirubin: 0.1 mg/dL — ABNORMAL LOW (ref 0.3–1.2)
Total Protein: 7.8 g/dL (ref 6.5–8.1)
Total Protein: 8.1 g/dL (ref 6.5–8.1)

## 2018-11-12 LAB — C-REACTIVE PROTEIN
CRP: 19.5 mg/dL — ABNORMAL HIGH (ref <1.0)
CRP: 12.4 mg/dL — ABNORMAL HIGH (ref <1.0)

## 2018-11-12 LAB — CBC WITH DIFFERENTIAL/PLATELET
Abs Immature Granulocytes: 0.05 10*3/uL (ref 0.00–0.07)
Abs Immature Granulocytes: 0.11 10*3/uL — ABNORMAL HIGH (ref 0.00–0.07)
Basophils Absolute: 0 10*3/uL (ref 0.0–0.1)
Basophils Absolute: 0 10*3/uL (ref 0.0–0.1)
Basophils Relative: 0 %
Basophils Relative: 0 %
Eosinophils Absolute: 0 10*3/uL (ref 0.0–0.5)
Eosinophils Absolute: 0 10*3/uL (ref 0.0–0.5)
Eosinophils Relative: 0 %
Eosinophils Relative: 0 %
HCT: 37.8 % (ref 36.0–46.0)
HCT: 37.2 % (ref 36.0–46.0)
Hemoglobin: 12.4 g/dL (ref 12.0–15.0)
Hemoglobin: 12.1 g/dL (ref 12.0–15.0)
Immature Granulocytes: 1 %
Immature Granulocytes: 1 %
Lymphocytes Relative: 13 %
Lymphocytes Relative: 18 %
Lymphs Abs: 1.3 10*3/uL (ref 0.7–4.0)
Lymphs Abs: 2.3 10*3/uL (ref 0.7–4.0)
MCH: 27.3 pg (ref 26.0–34.0)
MCH: 27.2 pg (ref 26.0–34.0)
MCHC: 32.8 g/dL (ref 30.0–36.0)
MCHC: 32.5 g/dL (ref 30.0–36.0)
MCV: 83.3 fL (ref 80.0–100.0)
MCV: 83.6 fL (ref 80.0–100.0)
Monocytes Absolute: 0.1 10*3/uL (ref 0.1–1.0)
Monocytes Absolute: 0.4 10*3/uL (ref 0.1–1.0)
Monocytes Relative: 1 %
Monocytes Relative: 3 %
Neutro Abs: 8.7 10*3/uL — ABNORMAL HIGH (ref 1.7–7.7)
Neutro Abs: 9.8 10*3/uL — ABNORMAL HIGH (ref 1.7–7.7)
Neutrophils Relative %: 85 %
Neutrophils Relative %: 78 %
Platelets: 313 10*3/uL (ref 150–400)
Platelets: 349 10*3/uL (ref 150–400)
RBC: 4.54 MIL/uL (ref 3.87–5.11)
RBC: 4.45 MIL/uL (ref 3.87–5.11)
RDW: 15.5 % (ref 11.5–15.5)
RDW: 15.6 % — ABNORMAL HIGH (ref 11.5–15.5)
WBC: 10.1 10*3/uL (ref 4.0–10.5)
WBC: 12.7 10*3/uL — ABNORMAL HIGH (ref 4.0–10.5)
nRBC: 0 % (ref 0.0–0.2)
nRBC: 0 % (ref 0.0–0.2)

## 2018-11-12 LAB — ABO/RH: ABO/RH(D): O POS

## 2018-11-12 LAB — TYPE AND SCREEN
ABO/RH(D): O POS
Antibody Screen: NEGATIVE

## 2018-11-12 LAB — TROPONIN I (HIGH SENSITIVITY): Troponin I (High Sensitivity): 12 ng/L (ref <18)

## 2018-11-12 LAB — FERRITIN: Ferritin: 96 ng/mL (ref 11–307)

## 2018-11-12 LAB — BRAIN NATRIURETIC PEPTIDE: B Natriuretic Peptide: 54.6 pg/mL (ref 0.0–100.0)

## 2018-11-12 LAB — PROCALCITONIN: Procalcitonin: <0.1 ng/mL

## 2018-11-12 LAB — GLUCOSE, CAPILLARY: Glucose-Capillary: 135 mg/dL — ABNORMAL HIGH (ref 70–99)

## 2018-11-12 LAB — D-DIMER, QUANTITATIVE: D-Dimer, Quant: 0.58 ug/mL-FEU — ABNORMAL HIGH (ref 0.00–0.50)

## 2018-11-12 LAB — LACTATE DEHYDROGENASE: LDH: 314 U/L — ABNORMAL HIGH (ref 98–192)

## 2018-11-12 LAB — SARS CORONAVIRUS 2 BY RT PCR (HOSPITAL ORDER, PERFORMED IN ~~LOC~~ HOSPITAL LAB): SARS Coronavirus 2: POSITIVE — AB

## 2018-11-12 MED ORDER — GUAIFENESIN-DM 100-10 MG/5ML PO SYRP
5.0000 mL | ORAL_SOLUTION | ORAL | Status: DC | PRN
  Administered 2018-11-12 (×2): 5 mL via ORAL
  Filled 2018-11-12 (×2): qty 5

## 2018-11-12 MED ORDER — GUAIFENESIN-DM 100-10 MG/5ML PO SYRP
10.0000 mL | ORAL_SOLUTION | ORAL | Status: DC | PRN
  Administered 2018-11-13: 10 mL via ORAL
  Filled 2018-11-12: qty 10

## 2018-11-12 MED ORDER — ENOXAPARIN SODIUM 60 MG/0.6ML ~~LOC~~ SOLN
50.0000 mg | SUBCUTANEOUS | Status: DC
  Administered 2018-11-13: 50 mg via SUBCUTANEOUS
  Filled 2018-11-12: qty 0.6

## 2018-11-12 MED ORDER — IPRATROPIUM-ALBUTEROL 20-100 MCG/ACT IN AERS: 1.0000 | INHALATION_SPRAY | Freq: Four times a day (QID) | RESPIRATORY_TRACT | Status: DC

## 2018-11-12 MED ORDER — ALBUTEROL SULFATE HFA 108 (90 BASE) MCG/ACT IN AERS
2.0000 | INHALATION_SPRAY | Freq: Four times a day (QID) | RESPIRATORY_TRACT | Status: DC
  Administered 2018-11-12 – 2018-11-13 (×4): 2 via RESPIRATORY_TRACT
  Filled 2018-11-12: qty 6.7

## 2018-11-12 MED ORDER — ENOXAPARIN SODIUM 40 MG/0.4ML ~~LOC~~ SOLN: 40.0000 mg | Freq: Two times a day (BID) | SUBCUTANEOUS | 0 refills | Status: DC

## 2018-11-12 MED ORDER — DEXAMETHASONE SODIUM PHOSPHATE 10 MG/ML IJ SOLN: 6.0000 mg | INTRAMUSCULAR | 0 refills | Status: DC

## 2018-11-12 MED ORDER — ONDANSETRON HCL 4 MG/2ML IJ SOLN: 4.0000 mg | Freq: Four times a day (QID) | INTRAMUSCULAR | Status: DC | PRN

## 2018-11-12 MED ORDER — VITAMIN C 500 MG PO TABS
500.0000 mg | ORAL_TABLET | Freq: Every day | ORAL | Status: DC
  Administered 2018-11-13: 08:00:00 500 mg via ORAL
  Filled 2018-11-12: qty 1

## 2018-11-12 MED ORDER — GUAIFENESIN-DM 100-10 MG/5ML PO SYRP: 5.0000 mL | ORAL_SOLUTION | ORAL | 0 refills | Status: DC | PRN

## 2018-11-12 MED ORDER — ACETAMINOPHEN 325 MG PO TABS: 650.0000 mg | ORAL_TABLET | Freq: Four times a day (QID) | ORAL | Status: DC | PRN

## 2018-11-12 MED ORDER — ZINC SULFATE 220 (50 ZN) MG PO CAPS
220.0000 mg | ORAL_CAPSULE | Freq: Every day | ORAL | Status: DC
  Administered 2018-11-13: 220 mg via ORAL
  Filled 2018-11-12: qty 1

## 2018-11-12 MED ORDER — HYDROCOD POLST-CPM POLST ER 10-8 MG/5ML PO SUER
5.0000 mL | Freq: Two times a day (BID) | ORAL | Status: DC | PRN
  Administered 2018-11-12: 5 mL via ORAL
  Filled 2018-11-12: qty 5

## 2018-11-12 MED ORDER — ASCORBIC ACID 500 MG PO TABS: 500.0000 mg | ORAL_TABLET | Freq: Every day | ORAL | 0 refills | Status: DC

## 2018-11-12 MED ORDER — ONDANSETRON HCL 4 MG PO TABS: 4.0000 mg | ORAL_TABLET | Freq: Four times a day (QID) | ORAL | Status: DC | PRN

## 2018-11-12 MED ORDER — ZINC SULFATE 220 (50 ZN) MG PO CAPS: 220.0000 mg | ORAL_CAPSULE | Freq: Every day | ORAL | 0 refills | Status: DC

## 2018-11-12 MED ORDER — DEXAMETHASONE 6 MG PO TABS
6.0000 mg | ORAL_TABLET | Freq: Every day | ORAL | Status: DC
  Administered 2018-11-12 – 2018-11-13 (×2): 6 mg via ORAL
  Filled 2018-11-12 (×2): qty 1

## 2018-11-12 MED ORDER — DOCUSATE SODIUM 100 MG PO CAPS: 100.0000 mg | ORAL_CAPSULE | Freq: Two times a day (BID) | ORAL | Status: DC | PRN

## 2018-11-12 MED ORDER — INSULIN ASPART 100 UNIT/ML ~~LOC~~ SOLN: 0.0000 [IU] | Freq: Three times a day (TID) | SUBCUTANEOUS | Status: DC

## 2018-11-12 NOTE — Progress Notes (Signed)
Pt was dx with viral pneumonia page and talked to Levada Dy if she want to put pt on any abx. Levada Dy NP states that the plan was already put in placed for this pt and will just follow through that. No new order was place. Will continue to monitor.

## 2018-11-12 NOTE — Progress Notes (Signed)
Ch spoke with pt via telephone. Pt was able to hold a conversation without labored breathing. Pt shared that she has improved since being admitted. 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 Pt contracted CV-19 from her son who was staying with her parents. The pt's father was working with a client that tested positive. The pt is the only one symptomatic and her family members have completed their quarantine as of today. Ch provided words of encouragement and reflected on the way the pt's line of work may change in the next few weeks. Pt presents to be recovering well and appreciated the call.    11/12/18 1430  Clinical Encounter Type  Visited With Patient  Visit Type Psychological support;Spiritual support;Social support  Spiritual Encounters  Spiritual Needs Emotional;Grief support  Stress Factors  Patient Stress Factors Health changes;Loss of control;Major life changes  Family Stress Factors Health changes

## 2018-11-12 NOTE — H&P (Signed)
[facility] History and Physical   Patient: Gina Clayton GNF:[id number]   PCP: Patient, No Pcp Per DOB: 05/12/1985   DOA: 11/12/2018   DOS: 11/12/2018   DOS: the patient was seen and examined on 11/12/2018  Patient coming from: The patient is coming from home  Chief Complaint: cough and shortnes of breath   HPI: Gina Clayton is a 33 y.o. female with no significant Past medical history Patient presents with complaints of cough and shortness of breath. This is been ongoing since 11/07/2018. Patient was diagnosed with COVID-19 infection on 11/02/2018. She started having progressively worsening fever. She has been taking Tylenol and ibuprofen without any benefit. No nausea at present.  But was vomiting at home and had some nausea this morning. Denies any diarrhea. No active bleeding. No leg swelling reported at home. Patient actually had exposure from her mother and son.  Works as a Advice workerschool librarian.  ED Course: Initially presented at [facility] ER.  [facility] hospital for COVID-19.  Patient was initially referred to acute hospital at the request but due to unavailability of antibiotics patient was transferred to [facility] for treatment of COVID 19.  Reportedly patient was hypoxic in the emergency room.  Requiring 2 L of oxygen.  At her baseline ambulates without support And is independent for most of her ADL; manages her medication on her own.  Review of Systems: as mentioned in the history of present illness.  All other systems reviewed and are negative.  Past Medical History:  Diagnosis Date  . Diabetes mellitus without complication (HCC)    diet and exercise controlled.   Past Surgical History:  Procedure Laterality Date  . CESAREAN SECTION     Social History:  reports that she has never smoked. She has never used smokeless tobacco. She reports current alcohol use. She reports that she does not use drugs.  No Known Allergies   Family  History  Problem Relation Age of Onset  . Hypertension Mother   . Diabetes Father   . Hypertension Father   . Diverticulitis Father      Prior to Admission medications   Medication Sig Start Date End Date Taking? Authorizing Provider  dexamethasone (DECADRON) 10 MG/ML injection Inject 0.6 mLs (6 mg total) into the vein daily. 11/12/18   Altamese DillingVachhani, Vaibhavkumar, MD  enoxaparin (LOVENOX) 40 MG/0.4ML injection Inject 0.4 mLs (40 mg total) into the skin every 12 (twelve) hours. 11/12/18   Altamese DillingVachhani, Vaibhavkumar, MD  FEROSUL 325 (65 Fe) MG tablet TK 1 T PO QOD 06/04/18   [provider]  guaiFENesin-dextromethorphan (ROBITUSSIN DM) 100-10 MG/5ML syrup Take 5 mLs by mouth every 4 (four) hours as needed for cough. 11/12/18   Altamese DillingVachhani, Vaibhavkumar, MD  Ipratropium-Albuterol (COMBIVENT) 20-100 MCG/ACT AERS respimat Inhale 1 puff into the lungs every 6 (six) hours. 11/12/18   Altamese DillingVachhani, Vaibhavkumar, MD  Levonorgestrel (SKYLA) 13.5 MG IUD by Intrauterine route.    [provider]  vitamin C (VITAMIN C) 500 MG tablet Take 1 tablet (500 mg total) by mouth daily. 11/13/18   Altamese DillingVachhani, Vaibhavkumar, MD  Vitamin D, Ergocalciferol, (DRISDOL) 1.25 MG (50000 UT) CAPS capsule TK 1 C PO 1 TIME WEEKLY WF 06/03/18   [provider]  zinc sulfate 220 (50 Zn) MG capsule Take 1 capsule (220 mg total) by mouth daily. 11/13/18   Altamese DillingVachhani, Vaibhavkumar, MD    Physical Exam: Vitals:   11/12/18 1834  BP: 128/85  Pulse: 93  Resp: 13  Temp: 98.6 F (37 C)  TempSrc: Oral  SpO2: 96%    General: Alert, Awake and Oriented to Time, Place and Person. Appear in mild distress, affect appropriate Eyes: PERRL, Conjunctiva normal ENT: Oral Mucosa clear moist. Neck: no JVD, no Abnormal Mass Or lumps Cardiovascular: S1 and S2 Present, no Murmur, Peripheral Pulses Present Respiratory: increased respiratory effort, Bilateral Air entry equal and Decreased, no use of accessory muscle, bilateral  Crackles, no  wheezes Abdomen: Bowel Sound present, Soft and no tenderness, no hernia Skin: no redness, no Rash, no induration Extremities: trace Pedal edema, no calf tenderness Neurologic: Grossly no focal neuro deficit. Bilaterally Equal motor strength  Labs on Admission:  CBC: Recent Labs  Lab 11/11/18 1918 11/12/18 0250  WBC 12.2* 10.1  NEUTROABS 9.4* 8.7*  HGB 12.8 12.4  HCT 39.3 37.8  MCV 82.9 83.3  PLT 302 245   Basic Metabolic Panel: Recent Labs  Lab 11/11/18 2018 11/12/18 0250  NA 137 137  K 3.4* 3.7  CL 100 104  CO2 25 23  GLUCOSE 101* 133*  BUN 14 12  CREATININE 0.90 0.78  CALCIUM 8.3* 8.1*   GFR: Estimated Creatinine Clearance: 110.1 mL/min (by C-G formula based on SCr of 0.78 mg/dL). Liver Function Tests: Recent Labs  Lab 11/11/18 2018 11/12/18 0250  AST 33 35  ALT 18 18  ALKPHOS 88 84  BILITOT 0.6 0.6  PROT 8.0 7.8  ALBUMIN 3.6 3.4*   No results for input(s): LIPASE, AMYLASE in the last 168 hours. No results for input(s): AMMONIA in the last 168 hours. Coagulation Profile: No results for input(s): INR, PROTIME in the last 168 hours. Cardiac Enzymes: No results for input(s): CKTOTAL, CKMB, CKMBINDEX, TROPONINI in the last 168 hours. BNP (last 3 results) No results for input(s): PROBNP in the last 8760 hours. HbA1C: No results for input(s): HGBA1C in the last 72 hours. CBG: No results for input(s): GLUCAP in the last 168 hours. Lipid Profile: No results for input(s): CHOL, HDL, LDLCALC, TRIG, CHOLHDL, LDLDIRECT in the last 72 hours. Thyroid Function Tests: No results for input(s): TSH, T4TOTAL, FREET4, T3FREE, THYROIDAB in the last 72 hours. Anemia Panel: No results for input(s): VITAMINB12, FOLATE, FERRITIN, TIBC, IRON, RETICCTPCT in the last 72 hours. Urine analysis: No results found for: COLORURINE, APPEARANCEUR, LABSPEC, PHURINE, GLUCOSEU, HGBUR, BILIRUBINUR, KETONESUR, PROTEINUR, UROBILINOGEN, NITRITE, LEUKOCYTESUR  Radiological Exams on  Admission: Dg Chest Portable 1 View  Result Date: 11/11/2018 CLINICAL DATA:  Pt to ED via POV stating that she tested + for COVID Friday before last. Pt states that she is having severe chest pain, shortness of breath and coughing.SOB, cough EXAM: PORTABLE CHEST 1 VIEW COMPARISON:  None. FINDINGS: Normal cardiac silhouette. Perihilar airspace densities. Low lung volumes. No acute osseous abnormality. IMPRESSION: Perihilar airspace densities consistent with viral pneumonia. Electronically Signed   By: Suzy Bouchard M.D.   On: 11/11/2018 20:29   Assessment/Plan 1. Acute respiratory disease due to COVID-19 virus Acute COVID-19 Viral illness Lab Results  Component Value Date   SARSCOV2NAA POSITIVE (A) 11/11/2018   SARSCOV2NAA NOT DETECTED 10/24/2018   CXR: hazy bilateral peripheral opacities  Recent Labs    11/12/18 0232  CRP 19.5*    Tmax last 24 hours: 103 Oxygen requirements: 2 LPM, currently on room air  Antibiotics: None Diuretics: None indicated Vitamin C and Zinc: Continue DVT Prophylaxis: Subcutaneous Heparin   Remdesivir: Not indicated right now, not hypoxic Steroids: Started on Decadron.  Will continue orally. Actemra: no indication for off-label use  of Actemra:  Prone positioning: Patient encouraged to stay in prone position as much as possible.  PPE During this encounter: Patient Isolation: Airborne + Droplet + Contact HCP PPE: CAPR, gown. gloves Patient PPE: None  2.. Obesity (BMI 30-39.9) Continue to monitor. Increase this patient's likelihood of serious infection and intubation.  3. Sinus tachycardia Likely in response to COVID-19 illness.  Monitor on telemetry  Nutrition: Regular diet DVT Prophylaxis: Subcutaneous Heparin   Advance goals of care discussion: Full code  Consults: none  Family Communication: no family was present at bedside, at the time of interview.  Disposition: Admitted as observation, telemetry unit. Likely to be discharged home,  in 1-2 days.  Severity of Illness: The appropriate patient status for this patient is OBSERVATION. Observation status is judged to be reasonable and necessary in order to provide the required intensity of service to ensure the patient's safety. The patient's presenting symptoms, physical exam findings, and initial radiographic and laboratory data in the context of their medical condition is felt to place them at decreased risk for further clinical deterioration. Furthermore, it is anticipated that the patient will be medically stable for discharge from the hospital within 2 midnights of admission. The following factors support the patient status of observation.   " The patient's presenting symptoms include shortness of breath cough and fever. " The physical exam findings include bilateral crackles and edema.  T-max of 101.  Tachypnea and tachycardia. " The initial radiographic and laboratory data are bilateral perihilar pneumonia.     Author: Lynden OxfordPranav Monish Haliburton, MD [facility] 11/12/2018  To reach On-call, see care teams to locate the attending and reach out to them via [email or web address]. If 7PM-7AM, please contact night-coverage If you still have difficulty reaching the attending provider, please page the [facility] (Director on Call) for [facility] on amion for assistance.

## 2018-11-12 NOTE — Progress Notes (Signed)
PT Cancellation Note  Patient Details Name: Ailee Pates MRN: [id number] DOB: 02-Aug-1985   Cancelled Treatment:    Reason Eval/Treat Not Completed: (Consult received and chart reviewed. Per chart review, patient pending transfer to [facility] facility for further medical management.  Will defer PT evaluation at this time due to imminent transfer; will initiate if transfer delayed.)   Swayzie Choate H. Owens Shark, PT, DPT, NCS 11/12/18, 1:46 PM [phone]

## 2018-11-12 NOTE — Discharge Summary (Signed)
[facility] [place] [facility]   PATIENT NAME: Gina Clayton    MR#:  [id number]  DATE OF BIRTH:  12/28/1985  DATE OF ADMISSION:  11/11/2018 ADMITTING PHYSICIAN: Hannah BeatJan A Mansy, MD  DATE OF DISCHARGE: 11/12/2018   PRIMARY CARE PHYSICIAN: Patient, No Pcp Per    ADMISSION DIAGNOSIS:  Pneumonia due to COVID-19 virus [U07.1, J12.89]  DISCHARGE DIAGNOSIS:  Active Problems:   Pneumonia due to 2019 novel coronavirus   SECONDARY DIAGNOSIS:   Past Medical History:  Diagnosis Date  . Diabetes mellitus without complication (HCC)    diet and exercise controlled.    HOSPITAL COURSE:   Patient was admitted with COVID-19 viral pneumonia. She was on oxygen. Patient requested to be transferred to [facility] as she was seen in the clinic last week but [facility] is full and on waiting list. Transfer the patient to [facility] as that is our healthcare systems unassigned place for COVID-19 positive patients.  DISCHARGE CONDITIONS:   Stable.  CONSULTS OBTAINED:    DRUG ALLERGIES:  No Known Allergies  DISCHARGE MEDICATIONS:   Allergies as of 11/12/2018   No Known Allergies     Medication List    TAKE these medications   ascorbic acid 500 MG tablet Commonly known as: VITAMIN C Take 1 tablet (500 mg total) by mouth daily. Start taking on: November 13, 2018   dexamethasone 10 MG/ML injection Commonly known as: DECADRON Inject 0.6 mLs (6 mg total) into the vein daily.   enoxaparin 40 MG/0.4ML injection Commonly known as: LOVENOX Inject 0.4 mLs (40 mg total) into the skin every 12 (twelve) hours.   FeroSul 325 (65 FE) MG tablet Generic drug: ferrous sulfate TK 1 T PO QOD   guaiFENesin-dextromethorphan 100-10 MG/5ML syrup Commonly known as: ROBITUSSIN DM Take 5 mLs by mouth every 4 (four) hours as needed for cough.   Ipratropium-Albuterol 20-100 MCG/ACT Aers respimat Commonly known as: COMBIVENT Inhale 1 puff into the lungs every 6  (six) hours.   Skyla 13.5 MG Iud Generic drug: Levonorgestrel by Intrauterine route.   Vitamin D (Ergocalciferol) 1.25 MG (50000 UT) Caps capsule Commonly known as: DRISDOL TK 1 C PO 1 TIME WEEKLY WF   zinc sulfate 220 (50 Zn) MG capsule Take 1 capsule (220 mg total) by mouth daily. Start taking on: November 13, 2018        DISCHARGE INSTRUCTIONS:    Follow with PMD in 1-2 weeks.  If you experience worsening of your admission symptoms, develop shortness of breath, life threatening emergency, suicidal or homicidal thoughts you must seek medical attention immediately by calling 911 or calling your MD immediately  if symptoms less severe.  You Must read complete instructions/literature along with all the possible adverse reactions/side effects for all the Medicines you take and that have been prescribed to you. Take any new Medicines after you have completely understood and accept all the possible adverse reactions/side effects.   Please note  You were cared for by a hospitalist during your hospital stay. If you have any questions about your discharge medications or the care you received while you were in the hospital after you are discharged, you can call the unit and asked to speak with the hospitalist on call if the hospitalist that took care of you is not available. Once you are discharged, your primary care physician will handle any further medical issues. Please note that NO REFILLS for any discharge medications will be authorized once you are  discharged, as it is imperative that you return to your primary care physician (or establish a relationship with a primary care physician if you do not have one) for your aftercare needs so that they can reassess your need for medications and monitor your lab values.    Today   CHIEF COMPLAINT:   Chief Complaint  Patient presents with  . Chest Pain  . Shortness of Breath  . COVID Positive    HISTORY OF PRESENT ILLNESS:  Gina Clayton  is a 33 y.o. female presented to the emergency room with increased shortness of breath and productive cough of clear mucus.  She was diagnosed with COVID-19 on 11/02/2018 at [facility].  She began having symptoms of increased shortness of breath as well as intermittent fevers and chills with malaise/fatigue and muscle aches on 11/06/2018.  Shortness of breath is made worse with minimal exertion such as walking less than one half city block on a flat surface.  She reports having developed nausea and vomiting today.  Temperature max at home has been 103 F.  She denies hematemesis, hematochezia, or melena.  She denies chest pain other than pain with severe cough.  She denies diarrhea or abdominal pain.  She denies a prior history of asthma or other pulmonary related problems.  She received Decadron IV shortly after arrival to the emergency room with improvement of oxygen saturation from 87% on room air to 96% currently on 2 L oxygen per nasal cannula.  Chest x-ray demonstrates perihilar opacities consistent with viral pneumonia.  EKG demonstrates sinus tachycardia with no ST elevations.  We have admitted her to the hospitalist service for further management.    VITAL SIGNS:  Blood pressure (!) 133/94, pulse 96, temperature 98.6 F (37 C), temperature source Oral, resp. rate 20, height 5\' 1"  (1.549 m), weight 104.3 kg, last menstrual period 10/31/2018, SpO2 95 %.  I/O:    Intake/Output Summary (Last 24 hours) at 11/12/2018 1252 Last data filed at 11/12/2018 0814 Gross per 24 hour  Intake 3 ml  Output 75 ml  Net -72 ml    PHYSICAL EXAMINATION:  GENERAL:  33 y.o.-year-old patient lying in the bed with no acute distress.  EYES: Pupils equal, round, reactive to light and accommodation. No scleral icterus. Extraocular muscles intact.  HEENT: Head atraumatic, normocephalic. Oropharynx and nasopharynx clear.  NECK:  Supple, no jugular venous distention. No thyroid enlargement, no tenderness.   LUNGS: Normal breath sounds bilaterally, no wheezing, have crepitation. No use of accessory muscles of respiration.  CARDIOVASCULAR: S1, S2 normal. No murmurs, rubs, or gallops.  ABDOMEN: Soft, non-tender, non-distended. Bowel sounds present. No organomegaly or mass.  EXTREMITIES: No pedal edema, cyanosis, or clubbing.  NEUROLOGIC: Cranial nerves II through XII are intact. Muscle strength 5/5 in all extremities. Sensation intact. Gait not checked.  PSYCHIATRIC: The patient is alert and oriented x 3.  SKIN: No obvious rash, lesion, or ulcer.   DATA REVIEW:   CBC Recent Labs  Lab 11/12/18 0250  WBC 10.1  HGB 12.4  HCT 37.8  PLT 313    Chemistries  Recent Labs  Lab 11/12/18 0250  NA 137  K 3.7  CL 104  CO2 23  GLUCOSE 133*  BUN 12  CREATININE 0.78  CALCIUM 8.1*  AST 35  ALT 18  ALKPHOS 84  BILITOT 0.6    Cardiac Enzymes No results for input(s): TROPONINI in the last 168 hours.  Microbiology Results  Results for orders placed or performed during the  hospital encounter of 11/11/18  SARS Coronavirus 2 [facility](Hospital order, Performed in [facility] hospital lab) Nasopharyngeal Nasopharyngeal Swab     Status: Abnormal   Collection Time: 11/11/18 12:13 AM   Specimen: Nasopharyngeal Swab  Result Value Ref Range Status   SARS Coronavirus 2 POSITIVE (A) NEGATIVE Final    Comment: RESULT CALLED TO, READ BACK BY AND VERIFIED WITH: MARICAR KIMREY ON 11/12/18 AT 0226 [facility] (NOTE) If result is NEGATIVE SARS-CoV-2 target nucleic acids are NOT DETECTED. The SARS-CoV-2 RNA is generally detectable in upper and lower  respiratory specimens during the acute phase of infection. The lowest  concentration of SARS-CoV-2 viral copies this assay can detect is 250  copies / mL. A negative result does not preclude SARS-CoV-2 infection  and should not be used as the sole basis for treatment or other  patient management decisions.  A negative result may occur with  improper specimen collection /  handling, submission of specimen other  than nasopharyngeal swab, presence of viral mutation(s) within the  areas targeted by this assay, and inadequate number of viral copies  (<250 copies / mL). A negative result must be combined with clinical  observations, patient history, and epidemiological information. If result is POSITIVE SARS-CoV-2 target nucleic acids are DETECTED.  The SARS-CoV-2 RNA is generally detectable in upper and lower  respiratory specimens during the acute phase of infection.  Positive  results are indicative of active infection with SARS-CoV-2.  Clinical  correlation with patient history and other diagnostic information is  necessary to determine patient infection status.  Positive results do  not rule out bacterial infection or co-infection with other viruses. If result is PRESUMPTIVE POSTIVE SARS-CoV-2 nucleic acids MAY BE PRESENT.   A presumptive positive result was obtained on the submitted specimen  and confirmed on repeat testing.  While 2019 novel coronavirus  (SARS-CoV-2) nucleic acids may be present in the submitted sample  additional confirmatory testing may be necessary for epidemiological  and / or clinical management purposes  to differentiate between  SARS-CoV-2 and other Sarbecovirus currently known to infect humans.  If clinically indicated additional testing with an alternate test  methodology [phone]([id number]) i s advised. The SARS-CoV-2 RNA is generally  detectable in upper and lower respiratory specimens during the acute  phase of infection. The expected result is Negative. Fact Sheet for Patients:  BoilerBrush.com.cyhttps://www.fda.gov/media/136312/download Fact Sheet for Healthcare Providers: https://pope.com/https://www.fda.gov/media/136313/download This test is not yet approved or cleared by the [place] FDA and has been authorized for detection and/or diagnosis of SARS-CoV-2 by FDA under an Emergency Use Authorization (EUA).  This EUA will remain in effect (meaning this  test can be used) for the duration of the COVID-19 declaration under Section 564(b)(1) of the Act, 21 U.S.C. section 360bbb-3(b)(1), unless the authorization is terminated or revoked sooner. Performed at [facility], [address] [place], [place] [address]   Culture, blood (Routine X 2) w Reflex to ID Panel     Status: None (Preliminary result)   Collection Time: 11/12/18  2:50 AM   Specimen: BLOOD  Result Value Ref Range Status   Specimen Description BLOOD RIGHT ANTECUBITAL  Final   Special Requests   Final    BOTTLES DRAWN AEROBIC AND ANAEROBIC Blood Culture results may not be optimal due to an excessive volume of blood received in culture bottles   Culture   Final    NO GROWTH < 12 HOURS Performed at [facility], [address] [place], [place] [address]  Report Status PENDING  Incomplete  Culture, blood (Routine X 2) w Reflex to ID Panel     Status: None (Preliminary result)   Collection Time: 11/12/18  2:51 AM   Specimen: BLOOD  Result Value Ref Range Status   Specimen Description BLOOD LEFT HAND  Final   Special Requests   Final    BOTTLES DRAWN AEROBIC AND ANAEROBIC Blood Culture adequate volume   Culture   Final    NO GROWTH < 12 HOURS Performed at [facility], [address] [place], [place] [address]    Report Status PENDING  Incomplete    RADIOLOGY:  Dg Chest Portable 1 View  Result Date: 11/11/2018 CLINICAL DATA:  Pt to ED via POV stating that she tested + for COVID Friday before last. Pt states that she is having severe chest pain, shortness of breath and coughing.SOB, cough EXAM: PORTABLE CHEST 1 VIEW COMPARISON:  None. FINDINGS: Normal cardiac silhouette. Perihilar airspace densities. Low lung volumes. No acute osseous abnormality. IMPRESSION: Perihilar airspace densities consistent with viral pneumonia. Electronically Signed   By: Genevive BiStewart  Edmunds M.D.   On: 11/11/2018 20:29    EKG:   Orders placed or performed during the  hospital encounter of 11/11/18  . EKG 12-Lead  . EKG 12-Lead  . EKG 12-Lead  . EKG 12-Lead      Management plans discussed with the patient, family and they are in agreement.  CODE STATUS:     Code Status Orders  (From admission, onward)         Start     Ordered   11/11/18 2257  Full code  Continuous     11/11/18 2256        Code Status History    This patient has a current code status but no historical code status.   Advance Care Planning Activity      TOTAL TIME TAKING CARE OF THIS PATIENT: 35 minutes.    Altamese DillingVaibhavkumar Maryam Feely M.D on 11/12/2018 at 12:52 PM  Between 7am to 6pm - Pager - [phone]  After 6pm go to [email or web address] - password [email or web address]  [facility]  Office  [phone]  CC: Primary care physician; Patient, No Pcp Per   Note: This dictation was prepared with Dragon dictation along with smaller phrase technology. Any transcriptional errors that result from this process are unintentional.

## 2018-11-12 NOTE — Progress Notes (Addendum)
Pt was a COVID positive at [facility] on 11/02/2018. Levada Dy NP place STAT order to retest pt. ED have not test the pt for COVID. Notified [facility] and talked to DR. Mansy and order to retest pt for covid. ED notified and talked to [facility] that COVID test must be done prior to pt going to our floor.   Update 0015: Pt was retest for COVID by the ED.

## 2018-11-12 NOTE — Progress Notes (Addendum)
Pt was admitted on the floor without no signs of distress. VSS stable. Pt was alert and oriented x 4. Pt callbell/ascom is within pt reached. Pt was educated on how to get in touch with staff and what to press when needing help. Will continue to monitor.  Update 0230: Lab called and informed nurse that pt covid resulted and was positive. Levada Dy NO was notified. Will continue to monitor.

## 2018-11-12 NOTE — Plan of Care (Signed)
  Problem: Education: Goal: Knowledge of General Education information will improve Description: Including pain rating scale, medication(s)/side effects and non-pharmacologic comfort measures Outcome: Progressing   Problem: Safety: Goal: Ability to remain free from injury will improve Outcome: Progressing   

## 2018-11-12 NOTE — Progress Notes (Signed)
Patient admitted to room 141 at [facility].  A/O x 4.  Saturating 97% on 2L [place].

## 2018-11-12 NOTE — Progress Notes (Signed)
[facility] TELEPHONE ENCOUNTER NOTE  Patient: Gina Clayton IRS:[id number]   PCP: Patient, No Pcp Per DOB: [birth year]   DOS: 11/12/2018     Received a phone call regarding transfer of Ms. Gina Clayton. Requesting: Dr. Anselm Jungling Reason for transfer: Positive COVID-19 infection History: 2 weeks before 11/02/2018 patient had some upper respiratory infection, was tested for COVID-19 was negative. After that she had an exposure to an individual who tested positive for COVID-19, went to [facility] ER after this exposure on 11/02/2018.  Her test was positive and she was informed about it on 11/05/2018.  Started having fever on 11/07/2018 which progressed to worsening cough and shortness of breath as well as chest pain on 11/10/2018.  To [facility] ER on 11/11/2018.  Admitted to the hospital and was started on IV steroids.  Vitals: Stable Labs: CRP 19.5, chest x-ray positive for multifocal pneumonia, hypoxic Treatment received: IV Decadron 6 mg every 24 hours.  Plan of care: The patient will be accepted for admission to [facility], telemetry unit. Requested the physician to consider inquiring about availability of Remdesivir.  Author: Berle Mull, MD [facility] 11/12/2018  If 7PM-7AM, please contact night-coverage at [email or web address],    Author:  Berle Mull, MD [facility] 11/12/2018  If 7PM-7AM, please contact night-coverage To reach On-call, see [email or web address]

## 2018-11-13 DIAGNOSIS — U071 COVID-19: Secondary | ICD-10-CM | POA: Diagnosis not present

## 2018-11-13 LAB — GLUCOSE, CAPILLARY
Glucose-Capillary: 119 mg/dL — ABNORMAL HIGH (ref 70–99)
Glucose-Capillary: 104 mg/dL — ABNORMAL HIGH (ref 70–99)
Glucose-Capillary: 146 mg/dL — ABNORMAL HIGH (ref 70–99)

## 2018-11-13 LAB — CBC WITH DIFFERENTIAL/PLATELET
Abs Immature Granulocytes: 0.12 10*3/uL — ABNORMAL HIGH (ref 0.00–0.07)
Basophils Absolute: 0 10*3/uL (ref 0.0–0.1)
Basophils Relative: 0 %
Eosinophils Absolute: 0 10*3/uL (ref 0.0–0.5)
Eosinophils Relative: 0 %
HCT: 37 % (ref 36.0–46.0)
Hemoglobin: 11.8 g/dL — ABNORMAL LOW (ref 12.0–15.0)
Immature Granulocytes: 1 %
Lymphocytes Relative: 19 %
Lymphs Abs: 2.3 10*3/uL (ref 0.7–4.0)
MCH: 27 pg (ref 26.0–34.0)
MCHC: 31.9 g/dL (ref 30.0–36.0)
MCV: 84.7 fL (ref 80.0–100.0)
Monocytes Absolute: 0.3 10*3/uL (ref 0.1–1.0)
Monocytes Relative: 3 %
Neutro Abs: 9 10*3/uL — ABNORMAL HIGH (ref 1.7–7.7)
Neutrophils Relative %: 77 %
Platelets: 361 10*3/uL (ref 150–400)
RBC: 4.37 MIL/uL (ref 3.87–5.11)
RDW: 15.7 % — ABNORMAL HIGH (ref 11.5–15.5)
WBC: 11.7 10*3/uL — ABNORMAL HIGH (ref 4.0–10.5)
nRBC: 0 % (ref 0.0–0.2)

## 2018-11-13 LAB — COMPREHENSIVE METABOLIC PANEL
ALT: 19 U/L (ref 0–44)
AST: 32 U/L (ref 15–41)
Albumin: 3.1 g/dL — ABNORMAL LOW (ref 3.5–5.0)
Alkaline Phosphatase: 78 U/L (ref 38–126)
Anion gap: 11 (ref 5–15)
BUN: 12 mg/dL (ref 6–20)
CO2: 24 mmol/L (ref 22–32)
Calcium: 8.8 mg/dL — ABNORMAL LOW (ref 8.9–10.3)
Chloride: 104 mmol/L (ref 98–111)
Creatinine, Ser: 0.7 mg/dL (ref 0.44–1.00)
GFR calc Af Amer: >60 mL/min (ref >60)
GFR calc non Af Amer: >60 mL/min (ref >60)
Glucose, Bld: 132 mg/dL — ABNORMAL HIGH (ref 70–99)
Potassium: 3.8 mmol/L (ref 3.5–5.1)
Sodium: 139 mmol/L (ref 135–145)
Total Bilirubin: 0.2 mg/dL — ABNORMAL LOW (ref 0.3–1.2)
Total Protein: 7.7 g/dL (ref 6.5–8.1)

## 2018-11-13 LAB — C-REACTIVE PROTEIN: CRP: 8.6 mg/dL — ABNORMAL HIGH (ref <1.0)

## 2018-11-13 LAB — INTERLEUKIN-6, PLASMA: Interleukin-6, Plasma: 8.3 pg/mL (ref 0.0–12.2)

## 2018-11-13 LAB — MAGNESIUM: Magnesium: 2.1 mg/dL (ref 1.7–2.4)

## 2018-11-13 LAB — ABO/RH: ABO/RH(D): O POS

## 2018-11-13 LAB — HEMOGLOBIN A1C
Hgb A1c MFr Bld: 6.7 % — ABNORMAL HIGH (ref 4.8–5.6)
Mean Plasma Glucose: 145.59 mg/dL

## 2018-11-13 LAB — HIV ANTIBODY (ROUTINE TESTING W REFLEX): HIV Screen 4th Generation wRfx: NONREACTIVE

## 2018-11-13 LAB — D-DIMER, QUANTITATIVE: D-Dimer, Quant: 0.53 ug/mL-FEU — ABNORMAL HIGH (ref 0.00–0.50)

## 2018-11-13 LAB — FERRITIN: Ferritin: 91 ng/mL (ref 11–307)

## 2018-11-13 MED ORDER — GUAIFENESIN-DM 100-10 MG/5ML PO SYRP: 10.0000 mL | ORAL_SOLUTION | ORAL | 0 refills | Status: DC | PRN

## 2018-11-13 MED ORDER — DEXAMETHASONE 6 MG PO TABS: 6.0000 mg | ORAL_TABLET | Freq: Every day | ORAL | 0 refills | Status: AC

## 2018-11-13 MED ORDER — ZINC SULFATE 220 (50 ZN) MG PO CAPS: 220.0000 mg | ORAL_CAPSULE | Freq: Every day | ORAL | 0 refills | Status: DC

## 2018-11-13 MED ORDER — ASCORBIC ACID 500 MG PO TABS: 500.0000 mg | ORAL_TABLET | Freq: Every day | ORAL | 0 refills | Status: DC

## 2018-11-13 NOTE — Discharge Summary (Signed)
[facility] Discharge Summary   Patient: Gina Clayton EKC:[id number]   PCP: Patient, No Pcp Per DOB: [birth year]   Date of admission: 11/12/2018   Date of discharge: 11/13/2018     Discharge Diagnoses:  Principal Problem:   Acute respiratory disease due to COVID-19 virus Active Problems:   Obesity (BMI 30-39.9)   Sinus tachycardia  Admitted From: home Disposition:  Home   Recommendations for Outpatient Follow-up:  1. Please follow up with PCP in 1 week  Follow-up Information    PCP. Schedule an appointment as soon as possible for a visit in 2 week(s).          Diet recommendation: cardiac diet  Activity: The patient is advised to gradually reintroduce usual activities,as tolerated .  Discharge Condition: good  Code Status: full code  History of present illness: As per the H and P dictated on admission, "Gina Clayton is a 33 y.o. female with no significant Past medical history Patient presents with complaints of cough and shortness of breath. This is been ongoing since 11/07/2018. Patient was diagnosed with COVID-19 infection on 11/02/2018. She started having progressively worsening fever. She has been taking Tylenol and ibuprofen without any benefit. No nausea at present.  But was vomiting at home and had some nausea this morning. Denies any diarrhea. No active bleeding. No leg swelling reported at home. Patient actually had exposure from her mother and son.  Works as a Paramedic  Hospital Course:  Summary of her active problems in the hospital is as following. 1. Acute respiratory disease due to COVID-19 virus Acute COVID-19 Viral illness Recent Labs[] Expand by Default       Lab Results  Component Value Date   SARSCOV2NAA POSITIVE (A) 11/11/2018   Stevens NOT DETECTED 10/24/2018     CXR: hazy bilateral peripheral opacities  Recent Labs (last 2 labs) [] Expand by Default     Recent Labs    11/12/18 0232  CRP 19.5*      Tmax last 24  hours: afebrile Oxygen requirements: currently on room air both at rest and on exertion  Antibiotics: None Diuretics: None indicated Vitamin C and Zinc: Continue DVT Prophylaxis: Subcutaneous Heparin   Remdesivir: Not indicated right now, not hypoxic Steroids: Started on Decadron.  Will continue orally. Actemra: no indication for off-label use of Actemra:  Prone positioning: Patient encouraged to stay in prone position as much as possible.  PPE During this encounter: Patient Isolation: Airborne + Droplet + Contact HCP PPE: CAPR, gown. gloves Patient PPE: None  2.. Obesity (BMI 30-39.9) Continue to monitor. Increase this patient's likelihood of serious infection and intubation.  3. Sinus tachycardia Likely in response to COVID-19 illness.  Monitor on telemetry  Patient was ambulatory without any assistance. On the day of the discharge the patient's vitals were stable, and no other acute medical condition were reported by patient. the patient was felt safe to be discharge at Home with no therapy needed on discharge.  Consultants: none Procedures: none  DISCHARGE MEDICATION: Allergies as of 11/13/2018   No Known Allergies     Medication List    TAKE these medications   ascorbic acid 500 MG tablet Commonly known as: VITAMIN C Take 1 tablet (500 mg total) by mouth daily. Start taking on: November 14, 2018   dexamethasone 6 MG tablet Commonly known as: DECADRON Take 1 tablet (6 mg total) by mouth daily for 7 days. Start taking on: November 14, 2018   FeroSul 325 (65 FE) MG tablet Generic  drug: ferrous sulfate TK 1 T PO QOD   guaiFENesin-dextromethorphan 100-10 MG/5ML syrup Commonly known as: ROBITUSSIN DM Take 10 mLs by mouth every 4 (four) hours as needed for cough.   Skyla 13.5 MG Iud Generic drug: Levonorgestrel by Intrauterine route.   Vitamin D (Ergocalciferol) 1.25 MG (50000 UT) Caps capsule Commonly known as: DRISDOL TK 1 C PO 1 TIME WEEKLY WF   zinc  sulfate 220 (50 Zn) MG capsule Take 1 capsule (220 mg total) by mouth daily. Start taking on: November 14, 2018      No Known Allergies Discharge Instructions    Diet - low sodium heart healthy   Complete by: As directed    Increase activity slowly   Complete by: As directed      Discharge Exam: Filed Weights   11/12/18 2035  Weight: 104.3 kg   Vitals:   11/13/18 0715 11/13/18 0805  BP:  (!) 130/94  Pulse:  88  Resp:  20  Temp:    SpO2: 92% 96%   General: Appear in mild distress, no Rash; Oral Mucosa Clear, moist. no Abnormal Mass Or lumps Cardiovascular: S1 and S2 Present, no Murmur, Respiratory: normal respiratory effort, Bilateral Air entry present and Clear to Auscultation, no Crackles, no wheezes Abdomen: Bowel Sound present, Soft and no tenderness, no hernia Extremities: no Pedal edema, no calf tenderness Neurology: alert and oriented to time, place, and person affect appropriate. normal without focal findings, mental status, speech normal, alert and oriented x3, PERLA, Motor strength 5/5 and symmetric and sensation grossly normal to light touch   The results of significant diagnostics from this hospitalization (including imaging, microbiology, ancillary and laboratory) are listed below for reference.    Significant Diagnostic Studies: Dg Chest Portable 1 View  Result Date: 11/11/2018 CLINICAL DATA:  Pt to ED via POV stating that she tested + for COVID Friday before last. Pt states that she is having severe chest pain, shortness of breath and coughing.SOB, cough EXAM: PORTABLE CHEST 1 VIEW COMPARISON:  None. FINDINGS: Normal cardiac silhouette. Perihilar airspace densities. Low lung volumes. No acute osseous abnormality. IMPRESSION: Perihilar airspace densities consistent with viral pneumonia. Electronically Signed   By: Genevive BiStewart  Edmunds M.D.   On: 11/11/2018 20:29    Microbiology: Recent Results (from the past 240 hour(s))  SARS Coronavirus 2 [facility](Hospital order, Performed  in [facility] hospital lab) Nasopharyngeal Nasopharyngeal Swab     Status: Abnormal   Collection Time: 11/11/18 12:13 AM   Specimen: Nasopharyngeal Swab  Result Value Ref Range Status   SARS Coronavirus 2 POSITIVE (A) NEGATIVE Final    Comment: RESULT CALLED TO, READ BACK BY AND VERIFIED WITH: MARICAR KIMREY ON 11/12/18 AT 0226 [facility] (NOTE) If result is NEGATIVE SARS-CoV-2 target nucleic acids are NOT DETECTED. The SARS-CoV-2 RNA is generally detectable in upper and lower  respiratory specimens during the acute phase of infection. The lowest  concentration of SARS-CoV-2 viral copies this assay can detect is 250  copies / mL. A negative result does not preclude SARS-CoV-2 infection  and should not be used as the sole basis for treatment or other  patient management decisions.  A negative result may occur with  improper specimen collection / handling, submission of specimen other  than nasopharyngeal swab, presence of viral mutation(s) within the  areas targeted by this assay, and inadequate number of viral copies  (<250 copies / mL). A negative result must be combined with clinical  observations, patient history, and epidemiological information. If result is  POSITIVE SARS-CoV-2 target nucleic acids are DETECTED.  The SARS-CoV-2 RNA is generally detectable in upper and lower  respiratory specimens during the acute phase of infection.  Positive  results are indicative of active infection with SARS-CoV-2.  Clinical  correlation with patient history and other diagnostic information is  necessary to determine patient infection status.  Positive results do  not rule out bacterial infection or co-infection with other viruses. If result is PRESUMPTIVE POSTIVE SARS-CoV-2 nucleic acids MAY BE PRESENT.   A presumptive positive result was obtained on the submitted specimen  and confirmed on repeat testing.  While 2019 novel coronavirus  (SARS-CoV-2) nucleic acids may be present in the submitted  sample  additional confirmatory testing may be necessary for epidemiological  and / or clinical management purposes  to differentiate between  SARS-CoV-2 and other Sarbecovirus currently known to infect humans.  If clinically indicated additional testing with an alternate test  methodology [phone]([id number]) i s advised. The SARS-CoV-2 RNA is generally  detectable in upper and lower respiratory specimens during the acute  phase of infection. The expected result is Negative. Fact Sheet for Patients:  BoilerBrush.com.cyhttps://www.fda.gov/media/136312/download Fact Sheet for Healthcare Providers: https://pope.com/https://www.fda.gov/media/136313/download This test is not yet approved or cleared by the [place] FDA and has been authorized for detection and/or diagnosis of SARS-CoV-2 by FDA under an Emergency Use Authorization (EUA).  This EUA will remain in effect (meaning this test can be used) for the duration of the COVID-19 declaration under Section 564(b)(1) of the Act, 21 U.S.C. section 360bbb-3(b)(1), unless the authorization is terminated or revoked sooner. Performed at [facility], [address] [place], [place] [address]   Culture, blood (Routine X 2) w Reflex to ID Panel     Status: None (Preliminary result)   Collection Time: 11/12/18  2:50 AM   Specimen: BLOOD  Result Value Ref Range Status   Specimen Description BLOOD RIGHT ANTECUBITAL  Final   Special Requests   Final    BOTTLES DRAWN AEROBIC AND ANAEROBIC Blood Culture results may not be optimal due to an excessive volume of blood received in culture bottles   Culture   Final    NO GROWTH 1 DAY Performed at [facility], [address] [place], [place] [address]    Report Status PENDING  Incomplete  Culture, blood (Routine X 2) w Reflex to ID Panel     Status: None (Preliminary result)   Collection Time: 11/12/18  2:51 AM   Specimen: BLOOD  Result Value Ref Range Status   Specimen Description BLOOD LEFT HAND  Final   Special  Requests   Final    BOTTLES DRAWN AEROBIC AND ANAEROBIC Blood Culture adequate volume   Culture   Final    NO GROWTH 1 DAY Performed at [facility], [address] [place], [place] [address]    Report Status PENDING  Incomplete     Labs: CBC: Recent Labs  Lab 11/11/18 1918 11/12/18 0250 11/12/18 2015 11/13/18 0235  WBC 12.2* 10.1 12.7* 11.7*  NEUTROABS 9.4* 8.7* 9.8* 9.0*  HGB 12.8 12.4 12.1 11.8*  HCT 39.3 37.8 37.2 37.0  MCV 82.9 83.3 83.6 84.7  PLT 302 313 349 361   Basic Metabolic Panel: Recent Labs  Lab 11/11/18 2018 11/12/18 0250 11/12/18 2015 11/13/18 0235  NA 137 137 138 139  K 3.4* 3.7 3.6 3.8  CL 100 104 102 104  CO2 25 23 26 24   GLUCOSE 101* 133* 138* 132*  BUN 14 12 12  12  CREATININE 0.90 0.78 0.77 0.70  CALCIUM 8.3* 8.1* 8.8* 8.8*  MG  --   --   --  2.1   Liver Function Tests: Recent Labs  Lab 11/11/18 2018 11/12/18 0250 11/12/18 2015 11/13/18 0235  AST 33 35 36 32  ALT 18 18 20 19   ALKPHOS 88 84 82 78  BILITOT 0.6 0.6 0.1* 0.2*  PROT 8.0 7.8 8.1 7.7  ALBUMIN 3.6 3.4* 3.2* 3.1*   No results for input(s): LIPASE, AMYLASE in the last 168 hours. No results for input(s): AMMONIA in the last 168 hours. Cardiac Enzymes: No results for input(s): CKTOTAL, CKMB, CKMBINDEX, TROPONINI in the last 168 hours. BNP (last 3 results) Recent Labs    11/12/18 2015  BNP 54.6   CBG: Recent Labs  Lab 11/12/18 2232 11/13/18 0816 11/13/18 1212 11/13/18 1523  GLUCAP 135* 119* 104* 146*   Time spent: 35 minutes  Signed:  Lynden OxfordPranav Burhan Barham  [facility] 11/13/2018

## 2018-11-13 NOTE — Discharge Instructions (Signed)
COVID-19: How to Protect Yourself and Others °Know how it spreads °· There is currently no vaccine to prevent coronavirus disease 2019 (COVID-19). °· The best way to prevent illness is to avoid being exposed to this virus. °· The virus is thought to spread mainly from person-to-person. °? Between people who are in close contact with one another (within about 6 feet). °? Through respiratory droplets produced when an infected person coughs, sneezes or talks. °? These droplets can land in the mouths or noses of people who are nearby or possibly be inhaled into the lungs. °? Some recent studies have suggested that COVID-19 may be spread by people who are not showing symptoms. °Everyone should °Clean your hands often °· Wash your hands often with soap and water for at least 20 seconds especially after you have been in a public place, or after blowing your nose, coughing, or sneezing. °· If soap and water are not readily available, use a hand sanitizer that contains at least 60% alcohol. Cover all surfaces of your hands and rub them together until they feel dry. °· Avoid touching your eyes, nose, and mouth with unwashed hands. °Avoid close contact °· Stay home if you are sick. °· Avoid close contact with people who are sick. °· Put distance between yourself and other people. °? Remember that some people without symptoms may be able to spread virus. °? This is especially important for people who are at higher risk of getting very sick.www.cdc.gov/coronavirus/2019-ncov/need-extra-precautions/people-at-higher-risk.html °Cover your mouth and nose with a cloth face cover when around others °· You could spread COVID-19 to others even if you do not feel sick. °· Everyone should wear a cloth face cover when they have to go out in public, for example to the grocery store or to pick up other necessities. °? Cloth face coverings should not be placed on young children under age 2, anyone who has trouble breathing, or is unconscious,  incapacitated or otherwise unable to remove the mask without assistance. °· The cloth face cover is meant to protect other people in case you are infected. °· Do NOT use a facemask meant for a healthcare worker. °· Continue to keep about 6 feet between yourself and others. The cloth face cover is not a substitute for social distancing. °Cover coughs and sneezes °· If you are in a private setting and do not have on your cloth face covering, remember to always cover your mouth and nose with a tissue when you cough or sneeze or use the inside of your elbow. °· Throw used tissues in the trash. °· Immediately wash your hands with soap and water for at least 20 seconds. If soap and water are not readily available, clean your hands with a hand sanitizer that contains at least 60% alcohol. °Clean and disinfect °· Clean AND disinfect frequently touched surfaces daily. This includes tables, doorknobs, light switches, countertops, handles, desks, phones, keyboards, toilets, faucets, and sinks. www.cdc.gov/coronavirus/2019-ncov/prevent-getting-sick/disinfecting-your-home.html °· If surfaces are dirty, clean them: Use detergent or soap and water prior to disinfection. °· Then, use a household disinfectant. You can see a list of EPA-registered household disinfectants here. °cdc.gov/coronavirus °08/14/2018 °This information is not intended to replace advice given to you by your health care provider. Make sure you discuss any questions you have with your health care provider. °Document Released: 07/24/2018 Document Revised: 08/22/2018 Document Reviewed: 07/24/2018 °Elsevier Patient Education © 2020 [employer] °COVID-19 Frequently Asked Questions °COVID-19 (coronavirus disease) is an infection that is caused by a large family of   viruses. Some viruses cause illness in people and others cause illness in animals like camels, cats, and bats. In some cases, the viruses that cause illness in animals can spread to humans. °Where did the  coronavirus come from? °In December 2019, [place] told the World Health Organization (WHO) of several cases of lung disease (human respiratory illness). These cases were linked to an open seafood and livestock market in the city of [place]. The link to the seafood and livestock market suggests that the virus may have spread from animals to humans. However, since that first outbreak in December, the virus has also been shown to spread from person to person. °What is the name of the disease and the virus? °Disease name °Early on, this disease was called novel coronavirus. This is because scientists determined that the disease was caused by a new (novel) respiratory virus. The World Health Organization (WHO) has now named the disease COVID-19, or coronavirus disease. °Virus name °The virus that causes the disease is called severe acute respiratory syndrome coronavirus 2 (SARS-CoV-2). °More information on disease and virus naming °World Health Organization (WHO): www.who.int/emergencies/diseases/novel-coronavirus-2019/technical-guidance/naming-the-coronavirus-disease-(covid-2019)-and-the-virus-that-causes-it °Who is at risk for complications from coronavirus disease? °Some people may be at higher risk for complications from coronavirus disease. This includes older adults and people who have chronic diseases, such as heart disease, diabetes, and lung disease. °If you are at higher risk for complications, take these extra precautions: °· Avoid close contact with people who are sick or have a fever or cough. Stay at least 3-6 ft (1-2 m) away from them, if possible. °· Wash your hands often with soap and water for at least 20 seconds. °· Avoid touching your face, mouth, nose, or eyes. °· Keep supplies on hand at home, such as food, medicine, and cleaning supplies. °· Stay home as much as possible. °· Avoid social gatherings and travel. °How does coronavirus disease spread? °The virus that causes coronavirus disease spreads  easily from person to person (is contagious). There are also cases of community-spread disease. This means the disease has spread to: °· People who have no known contact with other infected people. °· People who have not traveled to areas where there are known cases. °It appears to spread from one person to another through droplets from coughing or sneezing. °Can I get the virus from touching surfaces or objects? °There is still a lot that we do not know about the virus that causes coronavirus disease. Scientists are basing a lot of information on what they know about similar viruses, such as: °· Viruses cannot generally survive on surfaces for long. They need a human body (host) to survive. °· It is more likely that the virus is spread by close contact with people who are sick (direct contact), such as through: °? Shaking hands or hugging. °? Breathing in respiratory droplets that travel through the air. This can happen when an infected person coughs or sneezes on or near other people. °· It is less likely that the virus is spread when a person touches a surface or object that has the virus on it (indirect contact). The virus may be able to enter the body if the person touches a surface or object and then touches his or her face, eyes, nose, or mouth. °Can a person spread the virus without having symptoms of the disease? °It may be possible for the virus to spread before a person has symptoms of the disease, but this is most likely not the main way the virus is   spreading. It is more likely for the virus to spread by being in close contact with people who are sick and breathing in the respiratory droplets of a sick person's cough or sneeze. °What are the symptoms of coronavirus disease? °Symptoms vary from person to person and can range from mild to severe. Symptoms may include: °· Fever. °· Cough. °· Tiredness, weakness, or fatigue. °· Fast breathing or feeling short of breath. °These symptoms can appear anywhere  from 2 to 14 days after you have been exposed to the virus. If you develop symptoms, call your health care provider. People with severe symptoms may need hospital care. °If I am exposed to the virus, how long does it take before symptoms start? °Symptoms of coronavirus disease may appear anywhere from 2 to 14 days after a person has been exposed to the virus. If you develop symptoms, call your health care provider. °Should I be tested for this virus? °Your health care provider will decide whether to test you based on your symptoms, history of exposure, and your risk factors. °How does a health care provider test for this virus? °Health care providers will collect samples to send for testing. Samples may include: °· Taking a swab of fluid from the nose. °· Taking fluid from the lungs by having you cough up mucus (sputum) into a sterile cup. °· Taking a blood sample. °· Taking a stool or urine sample. °Is there a treatment or vaccine for this virus? °Currently, there is no vaccine to prevent coronavirus disease. Also, there are no medicines like antibiotics or antivirals to treat the virus. A person who becomes sick is given supportive care, which means rest and fluids. A person may also relieve his or her symptoms by using over-the-counter medicines that treat sneezing, coughing, and runny nose. These are the same medicines that a person takes for the common cold. °If you develop symptoms, call your health care provider. People with severe symptoms may need hospital care. °What can I do to protect myself and my family from this virus? ° °  ° °You can protect yourself and your family by taking the same actions that you would take to prevent the spread of other viruses. Take the following actions: °· Wash your hands often with soap and water for at least 20 seconds. If soap and water are not available, use alcohol-based hand sanitizer. °· Avoid touching your face, mouth, nose, or eyes. °· Cough or sneeze into a tissue,  sleeve, or elbow. Do not cough or sneeze into your hand or the air. °? If you cough or sneeze into a tissue, throw it away immediately and wash your hands. °· Disinfect objects and surfaces that you frequently touch every day. °· Avoid close contact with people who are sick or have a fever or cough. Stay at least 3-6 ft (1-2 m) away from them, if possible. °· Stay home if you are sick, except to get medical care. Call your health care provider before you get medical care. °· Make sure your vaccines are up to date. Ask your health care provider what vaccines you need. °What should I do if I need to travel? °Follow travel recommendations from your local health authority, the CDC, and WHO. °Travel information and advice °· Centers for Disease Control and Prevention (CDC): www.cdc.gov/coronavirus/2019-ncov/travelers/index.html °· World Health Organization (WHO): www.who.int/emergencies/diseases/novel-coronavirus-2019/travel-advice °Know the risks and take action to protect your health °· You are at higher risk of getting coronavirus disease if you are traveling to areas with   an outbreak or if you are exposed to travelers from areas with an outbreak. °· Wash your hands often and practice good hygiene to lower the risk of catching or spreading the virus. °What should I do if I am sick? °General instructions to stop the spread of infection °· Wash your hands often with soap and water for at least 20 seconds. If soap and water are not available, use alcohol-based hand sanitizer. °· Cough or sneeze into a tissue, sleeve, or elbow. Do not cough or sneeze into your hand or the air. °· If you cough or sneeze into a tissue, throw it away immediately and wash your hands. °· Stay home unless you must get medical care. Call your health care provider or local health authority before you get medical care. °· Avoid public areas. Do not take public transportation, if possible. °· If you can, wear a mask if you must go out of the house  or if you are in close contact with someone who is not sick. °Keep your home clean °· Disinfect objects and surfaces that are frequently touched every day. This may include: °? Counters and tables. °? Doorknobs and light switches. °? Sinks and faucets. °? Electronics such as phones, remote controls, keyboards, computers, and tablets. °· Wash dishes in hot, soapy water or use a dishwasher. Air-dry your dishes. °· Wash laundry in hot water. °Prevent infecting other household members °· Let healthy household members care for children and pets, if possible. If you have to care for children or pets, wash your hands often and wear a mask. °· Sleep in a different bedroom or bed, if possible. °· Do not share personal items, such as razors, toothbrushes, deodorant, combs, brushes, towels, and washcloths. °Where to find more information °Centers for Disease Control and Prevention (CDC) °· Information and news updates: www.cdc.gov/coronavirus/2019-ncov °World Health Organization (WHO) °· Information and news updates: www.who.int/emergencies/diseases/novel-coronavirus-2019 °· Coronavirus health topic: www.who.int/health-topics/coronavirus °· Questions and answers on COVID-19: www.who.int/news-room/q-a-detail/q-a-coronaviruses °· Global tracker: who.sprinklr.com °American Academy of Pediatrics (AAP) °· Information for families: www.healthychildren.org/English/health-issues/conditions/chest-lungs/Pages/2019-Novel-Coronavirus.aspx °The coronavirus situation is changing rapidly. Check your local health authority website or the CDC and WHO websites for updates and news. °When should I contact a health care provider? °· Contact your health care provider if you have symptoms of an infection, such as fever or cough, and you: °? Have been near anyone who is known to have coronavirus disease. °? Have come into contact with a person who is suspected to have coronavirus disease. °? Have traveled outside of the country. °When should I get  emergency medical care? °· Get help right away by calling your local emergency services (911 in the [place] if you have: °? Trouble breathing. °? Pain or pressure in your chest. °? Confusion. °? Blue-tinged lips and fingernails. °? Difficulty waking from sleep. °? Symptoms that get worse. °Let the emergency medical personnel know if you think you have coronavirus disease. °Summary °· A new respiratory virus is spreading from person to person and causing COVID-19 (coronavirus disease). °· The virus that causes COVID-19 appears to spread easily. It spreads from one person to another through droplets from coughing or sneezing. °· Older adults and those with chronic diseases are at higher risk of disease. If you are at higher risk for complications, take extra precautions. °· There is currently no vaccine to prevent coronavirus disease. There are no medicines, such as antibiotics or antivirals, to treat the virus. °· You can protect yourself and your family by washing your   hands often, avoiding touching your face, and covering your coughs and sneezes. °This information is not intended to replace advice given to you by your health care provider. Make sure you discuss any questions you have with your health care provider. °Document Released: 07/24/2018 Document Revised: 07/24/2018 Document Reviewed: 07/24/2018 °Elsevier Patient Education © 2020 Elsevier Inc. ° °

## 2018-11-14 LAB — INTERLEUKIN-6, PLASMA: Interleukin-6, Plasma: 7.2 pg/mL (ref 0.0–12.2)

## 2018-11-17 LAB — CULTURE, BLOOD (ROUTINE X 2)
Culture: NO GROWTH
Culture: NO GROWTH
Special Requests: ADEQUATE

## 2019-07-12 ENCOUNTER — Other Ambulatory Visit: Payer: Self-pay

## 2019-07-12 ENCOUNTER — Encounter: Payer: Self-pay | Admitting: Emergency Medicine

## 2019-07-12 ENCOUNTER — Ambulatory Visit
Admission: EM | Admit: 2019-07-12 | Discharge: 2019-07-12 | Disposition: A | Discharge status: Home / Self Care | Payer: BC Managed Care – PPO | Attending: Family Medicine | Admitting: Family Medicine

## 2019-07-12 DIAGNOSIS — B373 Candidiasis of vulva and vagina: Secondary | ICD-10-CM | POA: Insufficient documentation

## 2019-07-12 DIAGNOSIS — B3731 Acute candidiasis of vulva and vagina: Secondary | ICD-10-CM

## 2019-07-12 LAB — CHLAMYDIA/NGC RT PCR (ARMC ONLY)
Chlamydia Tr: NOT DETECTED
N gonorrhoeae: NOT DETECTED

## 2019-07-12 LAB — URINALYSIS, COMPLETE (UACMP) WITH MICROSCOPIC
Bilirubin Urine: NEGATIVE
Glucose, UA: NEGATIVE mg/dL
Ketones, ur: NEGATIVE mg/dL
Nitrite: NEGATIVE
Protein, ur: NEGATIVE mg/dL
Specific Gravity, Urine: 1.025 (ref 1.005–1.030)
pH: 5 (ref 5.0–8.0)

## 2019-07-12 LAB — WET PREP, GENITAL
Clue Cells Wet Prep HPF POC: NONE SEEN
Sperm: NONE SEEN
Trich, Wet Prep: NONE SEEN

## 2019-07-12 MED ORDER — FLUCONAZOLE 150 MG PO TABS: 150.0000 mg | ORAL_TABLET | Freq: Once | ORAL | 1 refills | Status: AC

## 2019-07-12 NOTE — ED Triage Notes (Signed)
Patient states that she has had a white discharge that started yesterday.  Patient denies itching.  Patient reports vaginal irritation.

## 2019-07-12 NOTE — ED Provider Notes (Signed)
[facility]    CSN: [id number] Arrival date & time: 07/12/19  0803      History   Chief Complaint Chief Complaint  Patient presents with  . Vaginal Discharge   HPI  34 year old female presents with vaginal discharge and vaginal irritation.  Patient reports that her symptoms started on Wednesday.  She reports white vaginal discharge and vaginal irritation.  She reports dysuria.  Denies itching.  She states that she is sexually active and is concerned about the possibility of STDs.  She desires STD testing today.  No relieving factors.  No abdominal pain.  No fever.  No other associated symptoms.  No other complaints.  Past Medical History:  Diagnosis Date  . Diabetes mellitus without complication (HCC)    diet and exercise controlled.    Patient Active Problem List   Diagnosis Date Noted  . Acute respiratory disease due to COVID-19 virus 11/12/2018  . Obesity (BMI 30-39.9) 11/12/2018  . Sinus tachycardia 11/12/2018  . Pneumonia due to 2019 novel coronavirus 11/11/2018    Past Surgical History:  Procedure Laterality Date  . CESAREAN SECTION      OB History   No obstetric history on file.      Home Medications    Prior to Admission medications   Medication Sig Start Date End Date Taking? Authorizing Provider  Levonorgestrel (SKYLA) 13.5 MG IUD by Intrauterine route.   Yes [provider]  vitamin C (VITAMIN C) 500 MG tablet Take 1 tablet (500 mg total) by mouth daily. 11/14/18  Yes Rolly Salter, MD  Vitamin D, Ergocalciferol, (DRISDOL) 1.25 MG (50000 UT) CAPS capsule TK 1 C PO 1 TIME WEEKLY WF 06/03/18  Yes [provider]  zinc sulfate 220 (50 Zn) MG capsule Take 1 capsule (220 mg total) by mouth daily. 11/14/18  Yes Rolly Salter, MD  fluconazole (DIFLUCAN) 150 MG tablet Take 1 tablet (150 mg total) by mouth once for 1 dose. Repeat dose in 72 hours. 07/12/19 07/12/19  Tommie Sams, DO  FEROSUL 325 (65 Fe) MG tablet TK 1 T PO QOD 06/04/18  07/12/19  [provider]    Family History Family History  Problem Relation Age of Onset  . Hypertension Mother   . Diabetes Father   . Hypertension Father   . Diverticulitis Father     Social History Social History   Tobacco Use  . Smoking status: Never Smoker  . Smokeless tobacco: Never Used  Substance Use Topics  . Alcohol use: Yes    Comment: rarely  . Drug use: No     Allergies   Patient has no known allergies.   Review of Systems Review of Systems  Constitutional: Negative.   Genitourinary: Positive for dysuria and vaginal discharge.   Physical Exam Triage Vital Signs ED Triage Vitals  Enc Vitals Group     BP 07/12/19 0823 (!) 139/108     Pulse Rate 07/12/19 0823 81     Resp 07/12/19 0823 16     Temp 07/12/19 0823 98.1 F (36.7 C)     Temp Source 07/12/19 0823 Oral     SpO2 07/12/19 0823 97 %     Weight 07/12/19 0820 231 lb (104.8 kg)     Height 07/12/19 0820 5' (1.524 m)     Head Circumference --      Peak Flow --      Pain Score 07/12/19 0819 1     Pain Loc --  Pain Edu? --      Excl. in [place]? --    Updated Vital Signs BP (!) 139/108 (BP Location: Right Arm)   Pulse 81   Temp 98.1 F (36.7 C) (Oral)   Resp 16   Ht 5' (1.524 m)   Wt 104.8 kg   LMP 07/05/2019 (Approximate)   SpO2 97%   BMI 45.11 kg/m   Visual Acuity Right Eye Distance:   Left Eye Distance:   Bilateral Distance:    Right Eye Near:   Left Eye Near:    Bilateral Near:     Physical Exam Vitals and nursing note reviewed.  Constitutional:      General: She is not in acute distress.    Appearance: Normal appearance. She is obese. She is not ill-appearing.  HENT:     Head: Normocephalic and atraumatic.  Eyes:     General:        Right eye: No discharge.        Left eye: No discharge.     Conjunctiva/sclera: Conjunctivae normal.  Cardiovascular:     Rate and Rhythm: Normal rate and regular rhythm.     Heart sounds: No murmur.  Pulmonary:     Effort:  Pulmonary effort is normal.     Breath sounds: Normal breath sounds. No wheezing, rhonchi or rales.  Neurological:     Mental Status: She is alert.  Psychiatric:        Mood and Affect: Mood normal.        Behavior: Behavior normal.    UC Treatments / Results  Labs (all labs ordered are listed, but only abnormal results are displayed) Labs Reviewed  WET PREP, GENITAL - Abnormal; Notable for the following components:      Result Value   Yeast Wet Prep HPF POC PRESENT (*)    WBC, Wet Prep HPF POC MANY (*)    All other components within normal limits  URINALYSIS, COMPLETE (UACMP) WITH MICROSCOPIC - Abnormal; Notable for the following components:   Hgb urine dipstick SMALL (*)    Leukocytes,Ua SMALL (*)    Bacteria, UA RARE (*)    All other components within normal limits  CHLAMYDIA/NGC RT PCR [facility] ONLY)    EKG   Radiology No results found.  Procedures Procedures (including critical care time)  Medications Ordered in UC Medications - No data to display  Initial Impression / Assessment and Plan / UC Course  I have reviewed the triage vital signs and the nursing notes.  Pertinent labs & imaging results that were available during my care of the patient were reviewed by me and considered in my medical decision making (see chart for details).    34 year old female presents with yeast vaginitis.  Treating with Diflucan.  Supportive care.  Final Clinical Impressions(s) / UC Diagnoses   Final diagnoses:  Yeast vaginitis   Discharge Instructions   None    ED Prescriptions    Medication Sig Dispense Auth. Provider   fluconazole (DIFLUCAN) 150 MG tablet Take 1 tablet (150 mg total) by mouth once for 1 dose. Repeat dose in 72 hours. 2 tablet Coral Spikes, DO     PDMP not reviewed this encounter.   Thersa Salt [place], [place] 07/12/19 [phone]

## 2020-02-16 ENCOUNTER — Other Ambulatory Visit: Payer: Self-pay

## 2020-02-16 ENCOUNTER — Encounter: Payer: Self-pay | Admitting: Emergency Medicine

## 2020-02-16 ENCOUNTER — Ambulatory Visit
Admission: EM | Admit: 2020-02-16 | Discharge: 2020-02-16 | Disposition: A | Discharge status: Home / Self Care | Payer: BC Managed Care – PPO | Attending: Family Medicine | Admitting: Family Medicine

## 2020-02-16 DIAGNOSIS — I10 Essential (primary) hypertension: Secondary | ICD-10-CM | POA: Diagnosis present

## 2020-02-16 DIAGNOSIS — N390 Urinary tract infection, site not specified: Secondary | ICD-10-CM | POA: Insufficient documentation

## 2020-02-16 LAB — URINALYSIS, COMPLETE (UACMP) WITH MICROSCOPIC
Glucose, UA: NEGATIVE mg/dL
Ketones, ur: 15 mg/dL — AB
Nitrite: NEGATIVE
Protein, ur: NEGATIVE mg/dL
Specific Gravity, Urine: 1.025 (ref 1.005–1.030)
pH: 5 (ref 5.0–8.0)

## 2020-02-16 MED ORDER — NITROFURANTOIN MONOHYD MACRO 100 MG PO CAPS: 100.0000 mg | ORAL_CAPSULE | Freq: Two times a day (BID) | ORAL | 0 refills | Status: AC

## 2020-02-16 MED ORDER — FLUCONAZOLE 200 MG PO TABS: 200.0000 mg | ORAL_TABLET | Freq: Every day | ORAL | 0 refills | Status: AC

## 2020-02-16 MED ORDER — PHENAZOPYRIDINE HCL 200 MG PO TABS: 200.0000 mg | ORAL_TABLET | Freq: Three times a day (TID) | ORAL | 0 refills | Status: AC

## 2020-02-16 NOTE — Discharge Instructions (Addendum)
Take the Macrobid twice daily on empty stomach for the next 5 days.  Use the Pyridium every 8 hours as needed for urinary discomfort and urgency.  Increase your oral fluid intake to increase your urine production to flush your urinary system.  If your symptoms do not improve follow-up with your primary care provider.  When you follow-up for your blood pressure I would advise that they repeat a urine specimen to ensure that the infection has resolved.

## 2020-02-16 NOTE — ED Triage Notes (Signed)
Patient c/o urinary urgency and frequency and lower back pain that started 3 days ago.

## 2020-02-16 NOTE — ED Provider Notes (Signed)
[facility]    CSN: [id number] Arrival date & time: 02/16/20  1524      History   Chief Complaint Chief Complaint  Patient presents with  . Back Pain  . Urinary Urgency    HPI Gina Clayton is a 34 y.o. female.   34 year old female here for evaluation of urinary urgency and frequency and low back pain.  Patient reports that she has had symptoms for the past 3 days.  She denies fever, blood in her urine, cloudiness to her urine, nausea, vomiting, diarrhea, or abdominal pain.  Patient is also hypertensive at the time of arrival with a blood pressure of 150/112.       Past Medical History:  Diagnosis Date  . Diabetes mellitus without complication (HCC)    diet and exercise controlled.    Patient Active Problem List   Diagnosis Date Noted  . Acute respiratory disease due to COVID-19 virus 11/12/2018  . Obesity (BMI 30-39.9) 11/12/2018  . Sinus tachycardia 11/12/2018  . Pneumonia due to 2019 novel coronavirus 11/11/2018    Past Surgical History:  Procedure Laterality Date  . CESAREAN SECTION      OB History   No obstetric history on file.      Home Medications    Prior to Admission medications   Medication Sig Start Date End Date Taking? Authorizing Provider  Levonorgestrel (SKYLA) 13.5 MG IUD by Intrauterine route.   Yes [provider]  nitrofurantoin, macrocrystal-monohydrate, (MACROBID) 100 MG capsule Take 1 capsule (100 mg total) by mouth 2 (two) times daily. 02/16/20   Becky Augusta, NP  phenazopyridine (PYRIDIUM) 200 MG tablet Take 1 tablet (200 mg total) by mouth 3 (three) times daily. 02/16/20   Becky Augusta, NP  FEROSUL 325 (65 Fe) MG tablet TK 1 T PO QOD 06/04/18 07/12/19  [provider]    Family History Family History  Problem Relation Age of Onset  . Hypertension Mother   . Diabetes Father   . Hypertension Father   . Diverticulitis Father     Social History Social History   Tobacco Use  . Smoking status:  Never Smoker  . Smokeless tobacco: Never Used  Vaping Use  . Vaping Use: Never used  Substance Use Topics  . Alcohol use: Yes    Comment: rarely  . Drug use: No     Allergies   Patient has no known allergies.   Review of Systems Review of Systems  Constitutional: Negative for activity change, appetite change, chills and fever.  Respiratory: Negative for cough and shortness of breath.   Cardiovascular: Negative for chest pain.  Genitourinary: Positive for dysuria, frequency and urgency. Negative for hematuria.  Musculoskeletal: Positive for back pain. Negative for myalgias.  Skin: Negative for rash.  Neurological: Negative for dizziness, syncope and headaches.  Hematological: Negative.   Psychiatric/Behavioral: Negative.      Physical Exam Triage Vital Signs ED Triage Vitals  Enc Vitals Group     BP 02/16/20 1541 (!) 150/112     Pulse Rate 02/16/20 1541 (!) 104     Resp 02/16/20 1541 16     Temp 02/16/20 1541 98.5 F (36.9 C)     Temp Source 02/16/20 1541 Oral     SpO2 02/16/20 1541 100 %     Weight 02/16/20 1539 240 lb (108.9 kg)     Height 02/16/20 1539 5' (1.524 m)     Head Circumference --      Peak Flow --  Pain Score 02/16/20 1539 4     Pain Loc --      Pain Edu? --      Excl. in GC? --    No data found.  Updated Vital Signs BP (!) 150/112 (BP Location: Left Arm)   Pulse (!) 104   Temp 98.5 F (36.9 C) (Oral)   Resp 16   Ht 5' (1.524 m)   Wt 240 lb (108.9 kg)   SpO2 100%   BMI 46.87 kg/m   Visual Acuity Right Eye Distance:   Left Eye Distance:   Bilateral Distance:    Right Eye Near:   Left Eye Near:    Bilateral Near:     Physical Exam Vitals and nursing note reviewed.  Constitutional:      General: She is not in acute distress.    Appearance: Normal appearance. She is obese. She is not toxic-appearing.  HENT:     Head: Normocephalic and atraumatic.  Eyes:     General: No scleral icterus.    Extraocular Movements: Extraocular  movements intact.     Conjunctiva/sclera: Conjunctivae normal.     Pupils: Pupils are equal, round, and reactive to light.  Cardiovascular:     Rate and Rhythm: Normal rate and regular rhythm.     Pulses: Normal pulses.     Heart sounds: Normal heart sounds. No murmur heard.  No gallop.   Pulmonary:     Effort: Pulmonary effort is normal.     Breath sounds: Normal breath sounds. No wheezing, rhonchi or rales.  Musculoskeletal:        General: No swelling or tenderness. Normal range of motion.  Skin:    General: Skin is warm and dry.     Capillary Refill: Capillary refill takes less than 2 seconds.     Findings: No erythema or rash.  Neurological:     General: No focal deficit present.     Mental Status: She is alert and oriented to person, place, and time.  Psychiatric:        Mood and Affect: Mood normal.        Behavior: Behavior normal.        Thought Content: Thought content normal.        Judgment: Judgment normal.      UC Treatments / Results  Labs (all labs ordered are listed, but only abnormal results are displayed) Labs Reviewed  URINALYSIS, COMPLETE (UACMP) WITH MICROSCOPIC - Abnormal; Notable for the following components:      Result Value   Hgb urine dipstick TRACE (*)    Bilirubin Urine SMALL (*)    Ketones, ur 15 (*)    Leukocytes,Ua SMALL (*)    Bacteria, UA FEW (*)    All other components within normal limits  URINE CULTURE    EKG   Radiology No results found.  Procedures Procedures (including critical care time)  Medications Ordered in UC Medications - No data to display  Initial Impression / Assessment and Plan / UC Course  I have reviewed the triage vital signs and the nursing notes.  Pertinent labs & imaging results that were available during my care of the patient were reviewed by me and considered in my medical decision making (see chart for details).   Patient's been having urinary urgency and frequency for the past 3 days.  Also has  some low back pain.  She states that her urine is cloudy but has not seen any blood in it.  She  does have a bit of burning with urination.  We will send urine for an evaluation.  Patient is also hypertensive on arrival with a BP of 150/112.  Review of the chart on patient's last few visits back to July 2020 places her blood pressure in the 140s to 160s systolic and over 100 diastolic.  Patient reports that she has an upcoming appointment with her primary care provider to discuss her hypertension and get started on medicines.  Patient denies chest pain or shortness of breath.  Patient is obese, has history of sleep apnea, and both mother and father have high blood pressure.  Urine shows trace hemoglobin, small bilirubin, 15 ketones, negative nitrates.  Small leukocytes few bacteria, 6-10 squamous 6-10 white blood cells.  There are also uric acid crystals present.  Will send urine for culture and treat for UTI with Macrobid twice daily x5 days.   Final Clinical Impressions(s) / UC Diagnoses   Final diagnoses:  Lower urinary tract infectious disease     Discharge Instructions     Take the Macrobid twice daily on empty stomach for the next 5 days.  Use the Pyridium every 8 hours as needed for urinary discomfort and urgency.  Increase your oral fluid intake to increase your urine production to flush your urinary system.  If your symptoms do not improve follow-up with your primary care provider.  When you follow-up for your blood pressure I would advise that they repeat a urine specimen to ensure that the infection has resolved.    ED Prescriptions    Medication Sig Dispense Auth. Provider   nitrofurantoin, macrocrystal-monohydrate, (MACROBID) 100 MG capsule Take 1 capsule (100 mg total) by mouth 2 (two) times daily. 10 capsule Becky Augusta, NP   phenazopyridine (PYRIDIUM) 200 MG tablet Take 1 tablet (200 mg total) by mouth 3 (three) times daily. 6 tablet Becky Augusta, NP     PDMP not  reviewed this encounter.   Becky Augusta, NP 02/16/20 [phone]

## 2020-02-18 LAB — URINE CULTURE: Special Requests: NORMAL

## 2020-05-13 IMAGING — DX PORTABLE CHEST - 1 VIEW
1 series · 1 of 1 positions shown · non-contrast
Comparison: None.

CLINICAL DATA: Pt to ED via POV stating that she tested + for COVID
[REDACTED] before last. Pt states that she is having severe chest pain,
shortness of breath and coughing.SOB, cough

EXAM:
PORTABLE CHEST 1 VIEW

[chest ap]
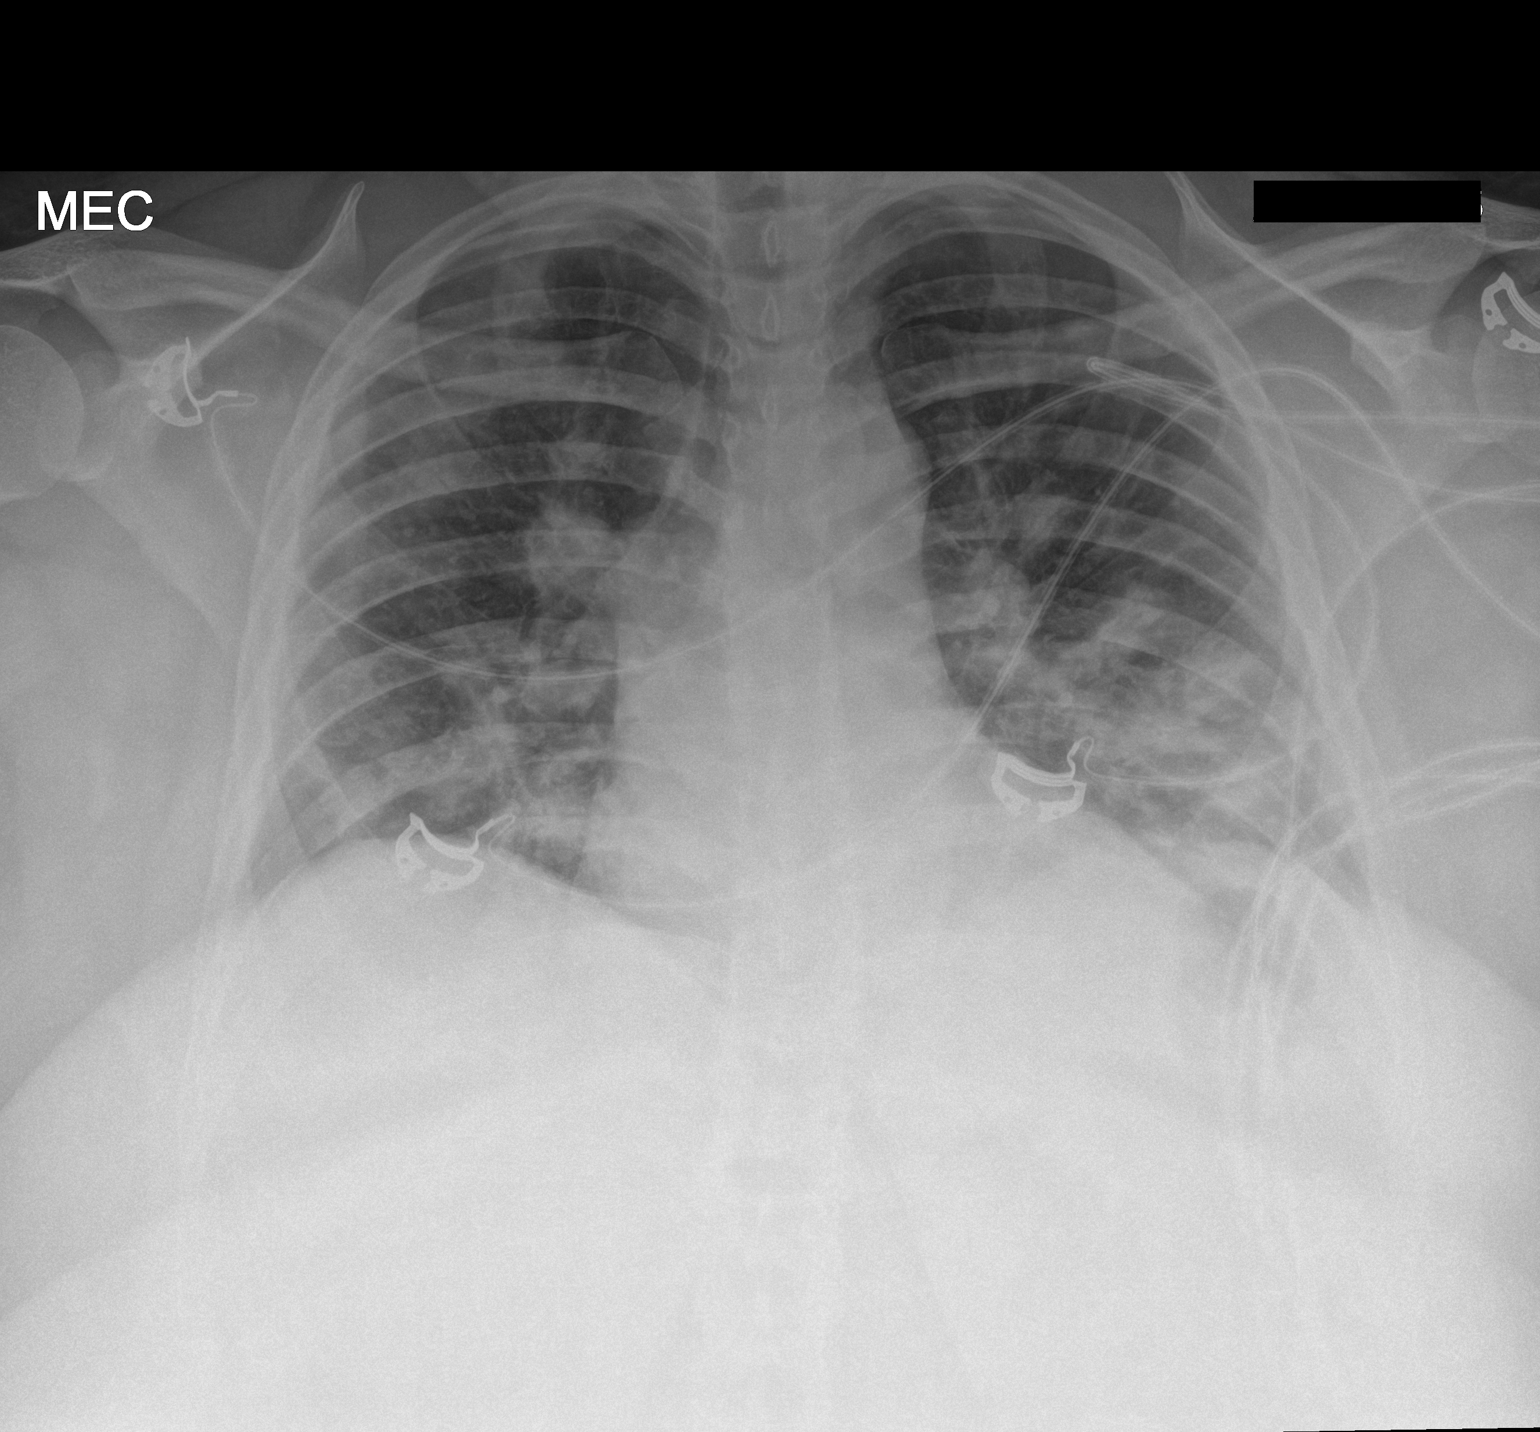

[1 of 1 positions shown; findings below may reference images not displayed]

FINDINGS: Normal cardiac silhouette. Perihilar airspace densities. Low lung
volumes. No acute osseous abnormality.
IMPRESSION: Perihilar airspace densities consistent with viral pneumonia.

## 2023-03-31 ENCOUNTER — Ambulatory Visit
Admission: EM | Admit: 2023-03-31 | Discharge: 2023-03-31 | Disposition: A | Discharge status: Home / Self Care | Payer: BC Managed Care – PPO | Attending: Physician Assistant | Admitting: Physician Assistant

## 2023-03-31 ENCOUNTER — Ambulatory Visit (INDEPENDENT_AMBULATORY_CARE_PROVIDER_SITE_OTHER): Payer: BC Managed Care – PPO

## 2023-03-31 DIAGNOSIS — R1010 Upper abdominal pain, unspecified: Secondary | ICD-10-CM | POA: Diagnosis present

## 2023-03-31 DIAGNOSIS — R0981 Nasal congestion: Secondary | ICD-10-CM | POA: Diagnosis present

## 2023-03-31 DIAGNOSIS — R112 Nausea with vomiting, unspecified: Secondary | ICD-10-CM | POA: Insufficient documentation

## 2023-03-31 DIAGNOSIS — R079 Chest pain, unspecified: Secondary | ICD-10-CM | POA: Insufficient documentation

## 2023-03-31 DIAGNOSIS — K219 Gastro-esophageal reflux disease without esophagitis: Secondary | ICD-10-CM | POA: Diagnosis present

## 2023-03-31 LAB — CBC WITH DIFFERENTIAL/PLATELET
Abs Immature Granulocytes: 0.02 10*3/uL (ref 0.00–0.07)
Basophils Absolute: 0.1 10*3/uL (ref 0.0–0.1)
Basophils Relative: 1 %
Eosinophils Absolute: 0 10*3/uL (ref 0.0–0.5)
Eosinophils Relative: 0 %
HCT: 38.7 % (ref 36.0–46.0)
Hemoglobin: 12.8 g/dL (ref 12.0–15.0)
Immature Granulocytes: 0 %
Lymphocytes Relative: 29 %
Lymphs Abs: 2.7 10*3/uL (ref 0.7–4.0)
MCH: 27.8 pg (ref 26.0–34.0)
MCHC: 33.1 g/dL (ref 30.0–36.0)
MCV: 84.1 fL (ref 80.0–100.0)
Monocytes Absolute: 0.5 10*3/uL (ref 0.1–1.0)
Monocytes Relative: 6 %
Neutro Abs: 6 10*3/uL (ref 1.7–7.7)
Neutrophils Relative %: 64 %
Platelets: 327 10*3/uL (ref 150–400)
RBC: 4.6 MIL/uL (ref 3.87–5.11)
RDW: 15.5 % (ref 11.5–15.5)
WBC: 9.4 10*3/uL (ref 4.0–10.5)
nRBC: 0 % (ref 0.0–0.2)

## 2023-03-31 LAB — COMPREHENSIVE METABOLIC PANEL
ALT: 15 U/L (ref 0–44)
AST: 16 U/L (ref 15–41)
Albumin: 4.1 g/dL (ref 3.5–5.0)
Alkaline Phosphatase: 90 U/L (ref 38–126)
Anion gap: 7 (ref 5–15)
BUN: 7 mg/dL (ref 6–20)
CO2: 26 mmol/L (ref 22–32)
Calcium: 8.8 mg/dL — ABNORMAL LOW (ref 8.9–10.3)
Chloride: 102 mmol/L (ref 98–111)
Creatinine, Ser: 0.7 mg/dL (ref 0.44–1.00)
GFR, Estimated: >60 mL/min (ref >60)
Glucose, Bld: 103 mg/dL — ABNORMAL HIGH (ref 70–99)
Potassium: 3.9 mmol/L (ref 3.5–5.1)
Sodium: 135 mmol/L (ref 135–145)
Total Bilirubin: 0.8 mg/dL (ref <1.2)
Total Protein: 8.2 g/dL — ABNORMAL HIGH (ref 6.5–8.1)

## 2023-03-31 LAB — RESP PANEL BY RT-PCR (FLU A&B, COVID) ARPGX2
Influenza A by PCR: NEGATIVE
Influenza B by PCR: NEGATIVE
SARS Coronavirus 2 by RT PCR: NEGATIVE

## 2023-03-31 LAB — LIPASE, BLOOD: Lipase: 37 U/L (ref 11–51)

## 2023-03-31 MED ORDER — ALUM & MAG HYDROXIDE-SIMETH 200-200-20 MG/5ML PO SUSP
30.0000 mL | Freq: Once | ORAL | Status: AC
  Administered 2023-03-31: 30 mL via ORAL

## 2023-03-31 MED ORDER — LIDOCAINE VISCOUS HCL 2 % MT SOLN
15.0000 mL | Freq: Once | OROMUCOSAL | Status: AC
  Administered 2023-03-31: 15 mL via ORAL

## 2023-03-31 NOTE — ED Triage Notes (Signed)
Chest and back pain that started this morning at 2 am. Burning sensation in her back below the bra line. Patient states that she has indigestion and takes ozempic. Chest is hurting between breast. Patient states that she vomited at 330 am. Vomited again again at 6. No relief. Patient states that this is the second time this has happened this week. Thinks it was Tuesday when it happened.

## 2023-03-31 NOTE — Discharge Instructions (Signed)
-  Chest x-ray is normal.  EKG without any acute abnormalities. - Your lab work all looks very reassuring. - I am glad you had some improvement/relief with the medication we gave you.  You may begin using over-the-counter Prilosec daily and use that over the next 2 weeks.  Also reduce dietary consumption of fatty, greasy or spicy foods.  Eat smaller meals.  Do not eat 3 hours before bedtime.  Increase fluids. - If you have any continued or worsening upper abdominal pain, continued vomiting, fevers, increased weakness please go to the emergency department to have imaging done.

## 2023-03-31 NOTE — ED Provider Notes (Signed)
[facility]    CSN: [id number] Arrival date & time: 03/31/23  6948      History   Chief Complaint No chief complaint on file.   HPI Gina Clayton is a 37 y.o. female presenting for an episode of burning epigastric/substernal pain radiating to mid back earlier this morning around 2 AM.  States she tried to change positions, took Gas-X and Tums.  States she had episode of nausea and vomiting a couple of hours later.  Pain was initially 8 out of 10 and is currently 2 out of 10.  Reports the same thing happened 3 days ago as well.  States it lasted for several hours.  She denies fever.  Woke up today with some congestion and runny nose.  Denies cough, sore throat, headaches, shortness of breath, palpitations, nausea/vomiting, diarrhea or constipation.  Has had acid reflux before but never this badly.  No other complaints.  HPI  Past Medical History:  Diagnosis Date   Diabetes mellitus without complication (HCC)    diet and exercise controlled.    Patient Active Problem List   Diagnosis Date Noted   Acute respiratory disease due to COVID-19 virus 11/12/2018   Obesity (BMI 30-39.9) 11/12/2018   Sinus tachycardia 11/12/2018   Pneumonia due to 2019 novel coronavirus 11/11/2018    Past Surgical History:  Procedure Laterality Date   CESAREAN SECTION      OB History   No obstetric history on file.      Home Medications    Prior to Admission medications   Medication Sig Start Date End Date Taking? Authorizing Provider  amLODipine (NORVASC) 10 MG tablet Take 10 mg by mouth daily.   Yes [provider]  Levonorgestrel (SKYLA) 13.5 MG IUD by Intrauterine route.   Yes [provider]  metFORMIN (GLUCOPHAGE-XR) 500 MG 24 hr tablet 1 tablet Orally Once a day with evening meal for 90 days 09/13/20  Yes [provider]  OZEMPIC, 0.25 OR 0.5 MG/DOSE, 2 MG/3ML SOPN 0.5 mg Subcutaneous weekly for 90 days 06/27/22  Yes [provider]   nitrofurantoin, macrocrystal-monohydrate, (MACROBID) 100 MG capsule Take 1 capsule (100 mg total) by mouth 2 (two) times daily. 02/16/20   Becky Augusta, NP  phenazopyridine (PYRIDIUM) 200 MG tablet Take 1 tablet (200 mg total) by mouth 3 (three) times daily. 02/16/20   Becky Augusta, NP  FEROSUL 325 (65 Fe) MG tablet TK 1 T PO QOD 06/04/18 07/12/19  [provider]    Family History Family History  Problem Relation Age of Onset   Hypertension Mother    Diabetes Father    Hypertension Father    Diverticulitis Father     Social History Social History   Tobacco Use   Smoking status: Never   Smokeless tobacco: Never  Vaping Use   Vaping status: Never Used  Substance Use Topics   Alcohol use: Yes    Comment: rarely   Drug use: No     Allergies   Patient has no known allergies.   Review of Systems Review of Systems  Constitutional:  Negative for chills, diaphoresis, fatigue and fever.  HENT:  Positive for congestion and rhinorrhea. Negative for sore throat.   Respiratory:  Negative for cough and shortness of breath.   Cardiovascular:  Positive for chest pain. Negative for palpitations and leg swelling.  Gastrointestinal:  Positive for nausea and vomiting. Negative for abdominal pain, blood in stool, constipation and diarrhea.  Genitourinary:  Negative for dysuria.  Musculoskeletal:  Positive for back pain. Negative for arthralgias and myalgias.  Skin:  Negative for rash.  Neurological:  Negative for weakness and headaches.     Physical Exam Triage Vital Signs ED Triage Vitals  Encounter Vitals Group     BP      Systolic BP Percentile      Diastolic BP Percentile      Pulse      Resp      Temp      Temp src      SpO2      Weight      Height      Head Circumference      Peak Flow      Pain Score      Pain Loc      Pain Education      Exclude from Growth Chart    No data found.  Updated Vital Signs BP 122/85 (BP Location: Right Arm)   Pulse 82    Temp 97.9 F (36.6 C)   Resp (!) 22   SpO2 98%       Physical Exam Vitals and nursing note reviewed.  Constitutional:      General: She is not in acute distress.    Appearance: Normal appearance. She is not ill-appearing or toxic-appearing.  HENT:     Head: Normocephalic and atraumatic.     Nose: Nose normal.     Mouth/Throat:     Mouth: Mucous membranes are moist.     Pharynx: Oropharynx is clear.  Eyes:     General: No scleral icterus.       Right eye: No discharge.        Left eye: No discharge.     Conjunctiva/sclera: Conjunctivae normal.  Cardiovascular:     Rate and Rhythm: Normal rate and regular rhythm.     Heart sounds: Normal heart sounds.  Pulmonary:     Effort: Pulmonary effort is normal. No respiratory distress.     Breath sounds: Normal breath sounds.  Abdominal:     Palpations: Abdomen is soft.     Tenderness: There is abdominal tenderness (RUQ). There is no right CVA tenderness, left CVA tenderness, guarding or rebound.  Musculoskeletal:     Cervical back: Neck supple.  Skin:    General: Skin is dry.  Neurological:     General: No focal deficit present.     Mental Status: She is alert. Mental status is at baseline.     Motor: No weakness.     Gait: Gait normal.  Psychiatric:        Mood and Affect: Mood normal.        Behavior: Behavior normal.      UC Treatments / Results  Labs (all labs ordered are listed, but only abnormal results are displayed) Labs Reviewed  COMPREHENSIVE METABOLIC PANEL - Abnormal; Notable for the following components:      Result Value   Glucose, Bld 103 (*)    Calcium 8.8 (*)    Total Protein 8.2 (*)    All other components within normal limits  RESP PANEL BY RT-PCR (FLU A&B, COVID) ARPGX2  CBC WITH DIFFERENTIAL/PLATELET  LIPASE, BLOOD    EKG   Radiology DG Chest 2 View Result Date: 03/31/2023 CLINICAL DATA:  Chest pain, back pain, congestion EXAM: CHEST - 2 VIEW COMPARISON:  11/11/2018 FINDINGS: The heart  size and mediastinal contours are within normal limits. Both lungs are clear. The visualized skeletal structures are unremarkable. IMPRESSION: No  active cardiopulmonary disease. Electronically Signed   By: Sharlet Salina M.D.   On: 03/31/2023 09:23    Procedures ED EKG  Date/Time: 03/31/2023 9:05 AM  Performed by: Shirlee Latch, PA-C Authorized by: Shirlee Latch, PA-C   Previous ECG:    Previous ECG:  Compared to current   Similarity:  No change Interpretation:    Interpretation: abnormal   Rate:    ECG rate:  78   ECG rate assessment: normal   Rhythm:    Rhythm: sinus rhythm   Ectopy:    Ectopy: none   QRS:    QRS axis:  Normal   QRS intervals:  Normal   QRS conduction: normal   ST segments:    ST segments:  Normal T waves:    T waves: non-specific   Comments:     Normal sinus rhythm. Non specific T wave abnormality (seen on EKG from 2020).  (including critical care time)  Medications Ordered in UC Medications  alum & mag hydroxide-simeth (MAALOX/MYLANTA) 200-200-20 MG/5ML suspension 30 mL (30 mLs Oral Given 03/31/23 0947)    And  lidocaine (XYLOCAINE) 2 % viscous mouth solution 15 mL (15 mLs Oral Given 03/31/23 0948)    Initial Impression / Assessment and Plan / UC Course  I have reviewed the triage vital signs and the nursing notes.  Pertinent labs & imaging results that were available during my care of the patient were reviewed by me and considered in my medical decision making (see chart for details).   37 year old female presents for an episode of burning epigastric/substernal pain radiating to back with nausea and vomiting earlier this morning.  She continues to have upper abdominal discomfort but it is mild compared to what it was early this morning.  Has slight nasal congestion upon waking.  No fever, fatigue, cough, sore throat, shortness of breath, or chest pain at this time.  Similar symptoms occurred 3 days ago.  Of note, she is on Ozempic and has been  on this medication for the past 9 months.  Has been tolerating it well overall.  Patient's initial complaint is acute severe chest and back pain which could indicate threat to life and bodily harm.  Vitals are normal and stable.  She is overall well-appearing.  Normal HEENT exam.  Chest clear to auscultation heart regular rate and rhythm.  Abdomen soft with mild right upper quadrant tenderness.  No epigastric tenderness or CVA tenderness.  EKG performed today shows normal sinus rhythm with a nonspecific T wave abnormality.  No changes from EKG performed in 2020.  Chest x-ray performed today shows no acute abnormality.  Respiratory panel obtained to assess for flu/COVID.  All negative.  Patient given GI cocktail.  She is belching and burping in exam room.  Will obtain CBC, CMP and lipase.  CBC is normal.  Lipase normal.  CMP without any significant abnormalities.  Reviewed all results with patient.  She does report relief of symptoms with a GI cocktail.  Suspect her abdominal pain is related to reflux/gastritis.  Discussed dietary changes/lifestyle modifications.  Advised Prilosec and Gas-X.  She declines any prescription meds.  Advised to follow-up with PCP if this is an ongoing issue.  We reviewed going to emergency department if fever, persistent or worsening/localized abdominal pain, intractable vomiting, weakness, black or bloody stool, etc.    Final Clinical Impressions(s) / UC Diagnoses   Final diagnoses:  Chest pain, unspecified type  Gastroesophageal reflux disease, unspecified whether esophagitis present  Nausea  and vomiting, unspecified vomiting type  Upper abdominal pain  Nasal congestion     Discharge Instructions      -Chest x-ray is normal.  EKG without any acute abnormalities. - Your lab work all looks very reassuring. - I am glad you had some improvement/relief with the medication we gave you.  You may begin using over-the-counter Prilosec daily and use that over the  next 2 weeks.  Also reduce dietary consumption of fatty, greasy or spicy foods.  Eat smaller meals.  Do not eat 3 hours before bedtime.  Increase fluids. - If you have any continued or worsening upper abdominal pain, continued vomiting, fevers, increased weakness please go to the emergency department to have imaging done.     ED Prescriptions   None    PDMP not reviewed this encounter.   Shirlee Latch, PA-C 03/31/23 1032
